# Patient Record
Sex: Female | Born: 1950 | Race: Black or African American | Hispanic: No | Smoking: Current every day smoker
Health system: Southern US, Community
[De-identification: ages and names within clinical notes are randomized; demographics above are authoritative.]

## PROBLEM LIST (undated history)

## (undated) DIAGNOSIS — I10 Essential (primary) hypertension: Secondary | ICD-10-CM

## (undated) DIAGNOSIS — J45909 Unspecified asthma, uncomplicated: Secondary | ICD-10-CM

## (undated) DIAGNOSIS — J449 Chronic obstructive pulmonary disease, unspecified: Secondary | ICD-10-CM

---

## 1980-07-01 HISTORY — PX: TUBAL LIGATION: SHX77

## 2007-05-20 ENCOUNTER — Ambulatory Visit: Payer: Self-pay | Admitting: Family Medicine

## 2007-05-26 ENCOUNTER — Ambulatory Visit: Payer: Self-pay | Admitting: *Deleted

## 2007-06-23 ENCOUNTER — Ambulatory Visit: Payer: Self-pay | Admitting: Internal Medicine

## 2007-07-01 ENCOUNTER — Ambulatory Visit: Payer: Self-pay | Admitting: Internal Medicine

## 2007-07-03 ENCOUNTER — Ambulatory Visit: Payer: Self-pay | Admitting: Internal Medicine

## 2007-08-13 ENCOUNTER — Encounter: Admission: RE | Admit: 2007-08-13 | Discharge: 2007-08-13 | Payer: Self-pay | Admitting: Family Medicine

## 2007-09-01 ENCOUNTER — Ambulatory Visit: Payer: Self-pay | Admitting: Internal Medicine

## 2007-09-16 ENCOUNTER — Encounter: Admission: RE | Admit: 2007-09-16 | Discharge: 2007-10-07 | Payer: Self-pay | Admitting: Family Medicine

## 2008-07-01 HISTORY — PX: ROTATOR CUFF REPAIR: SHX139

## 2012-11-16 ENCOUNTER — Encounter (HOSPITAL_COMMUNITY): Payer: Self-pay | Admitting: Emergency Medicine

## 2012-11-16 ENCOUNTER — Emergency Department (INDEPENDENT_AMBULATORY_CARE_PROVIDER_SITE_OTHER)
Admission: EM | Admit: 2012-11-16 | Discharge: 2012-11-16 | Disposition: A | Discharge status: Home / Self Care | Payer: Medicaid Other | Source: Home / Self Care | Attending: Emergency Medicine | Admitting: Emergency Medicine

## 2012-11-16 DIAGNOSIS — J309 Allergic rhinitis, unspecified: Secondary | ICD-10-CM

## 2012-11-16 DIAGNOSIS — J40 Bronchitis, not specified as acute or chronic: Secondary | ICD-10-CM

## 2012-11-16 DIAGNOSIS — J302 Other seasonal allergic rhinitis: Secondary | ICD-10-CM

## 2012-11-16 HISTORY — DX: Essential (primary) hypertension: I10

## 2012-11-16 HISTORY — DX: Unspecified asthma, uncomplicated: J45.909

## 2012-11-16 MED ORDER — PREDNISONE 20 MG PO TABS: 40.0000 mg | ORAL_TABLET | Freq: Every day | ORAL | Status: AC

## 2012-11-16 MED ORDER — AZITHROMYCIN 250 MG PO TABS: 250.0000 mg | ORAL_TABLET | Freq: Every day | ORAL | Status: AC

## 2012-11-16 NOTE — ED Notes (Addendum)
Pt c/o sneezing, runny nose, cough and congestion x 1 week. Granddaughter has been sick as well. Has been taking OTC allergy medicine and nasal spray with no relief. Allergy symptoms have exacerbated her asthma and she been using rescue inhaler with no relief. Has headaches. Pt is alert and oriented.

## 2012-11-16 NOTE — ED Provider Notes (Signed)
History     CSN: [id number]  Arrival date & time 11/16/12  1351   First MD Initiated Contact with Patient 11/16/12 1455      Chief Complaint  Patient presents with  . URI    (Consider location/radiation/quality/duration/timing/severity/associated sxs/prior treatment) HPI Comments: Patient presents urgent care describing that she's been taking multiple allergy medicines in use of Zyrtec, Flonase and her albuterol frequently. Every year she develops seasonal allergies. She has been referred to see an allergist by her primary care Dr. and they were proposing to sensitization treatment which she has not yet pursued. Describe dry cough productive cough throughout the day but most pronounced at night with intermittent wheezing. Denies any fevers. Congested and runny nose frequent sneezing. Denies any chest pains, arthralgias, myalgias or unintentional weight loss. She feels she is developing bronchitis now, as she tends to do this every year. Her Dr. usually treats her with z-pack...  Patient is a 62 y.o. female presenting with URI. The history is provided by the patient.  URI Presenting symptoms: congestion, cough, rhinorrhea and sore throat   Presenting symptoms: no fatigue and no fever   Severity:  Moderate Onset quality:  Gradual Timing:  Intermittent Progression:  Waxing and waning Chronicity:  Recurrent Relieved by:  Prescription medications Worsened by:  Nothing tried Associated symptoms: wheezing   Associated symptoms: no arthralgias, no headaches, no neck pain and no sinus pain   Risk factors: no immunosuppression, no recent travel and no sick contacts     Past Medical History  Diagnosis Date  . Hypertension   . Asthma     Past Surgical History  Procedure Laterality Date  . Rotator cuff repair    . Tubal ligation      History reviewed. No pertinent family history.  History  Substance Use Topics  . Smoking status: Current Every Day Smoker -- 0.50 packs/day   Types: Cigarettes  . Smokeless tobacco: Not on file  . Alcohol Use: No    OB History   Grav Para Term Preterm Abortions TAB SAB Ect Mult Living                  Review of Systems  Constitutional: Negative for fever, chills, diaphoresis, activity change, appetite change and fatigue.  HENT: Positive for congestion, sore throat and rhinorrhea. Negative for neck pain.   Respiratory: Positive for cough and wheezing. Negative for apnea, chest tightness and shortness of breath.   Musculoskeletal: Negative for arthralgias and gait problem.  Skin: Negative for rash.  Neurological: Negative for headaches.  Hematological: Negative for adenopathy. Does not bruise/bleed easily.    Allergies  Codeine and Penicillins  Home Medications   Current Outpatient Rx  Name  Route  Sig  Dispense  Refill  . cetirizine (ZYRTEC) 10 MG tablet   Oral   Take 10 mg by mouth daily.         . fluticasone (FLONASE) 50 MCG/ACT nasal spray   Nasal   Place 2 sprays into the nose daily.         Marland Kitchen lisinopril-hydrochlorothiazide (PRINZIDE,ZESTORETIC) 20-25 MG per tablet   Oral   Take 1 tablet by mouth daily.         . montelukast (SINGULAIR) 10 MG tablet   Oral   Take 10 mg by mouth at bedtime.         Marland Kitchen omeprazole (PRILOSEC) 10 MG capsule   Oral   Take 10 mg by mouth daily.         Marland Kitchen  potassium chloride (KLOR-CON) 20 MEQ packet   Oral   Take 20 mEq by mouth 2 (two) times daily.         . simvastatin (ZOCOR) 20 MG tablet   Oral   Take 20 mg by mouth every evening.         Marland Kitchen azithromycin (ZITHROMAX) 250 MG tablet   Oral   Take 1 tablet (250 mg total) by mouth daily. Take first 2 tablets together, then 1 every day until finished.   6 tablet   0   . predniSONE (DELTASONE) 20 MG tablet   Oral   Take 2 tablets (40 mg total) by mouth daily.   14 tablet   0     BP 123/80  Pulse 86  Temp(Src) 98.3 F (36.8 C) (Oral)  Resp 20  SpO2 97%  Physical Exam  Nursing note and vitals  reviewed. Constitutional: Vital signs are normal. She appears well-developed and well-nourished.  Non-toxic appearance. She does not have a sickly appearance. She does not appear ill. No distress.  HENT:  Head: Normocephalic.  Mouth/Throat: No oropharyngeal exudate.  Eyes: Conjunctivae are normal. Pupils are equal, round, and reactive to light. No scleral icterus.  Neck: No JVD present.  Pulmonary/Chest: Effort normal and breath sounds normal. No respiratory distress. She has no decreased breath sounds. She has no wheezes. She has no rhonchi. She has no rales. She exhibits no tenderness.  Lymphadenopathy:    She has no cervical adenopathy.  Neurological: She is alert.  Skin: No erythema.    ED Course  Procedures (including critical care time)  Labs Reviewed - No data to display No results found.   1. Bronchitis   2. Seasonal allergies       MDM  Are mentally induced bronchitis. Have encouraged and instructed patient to use prednisone for the next 7 days. Start with antibiotics if no improvement after 2-3 days. Patient is afebrile no respiratory distress. Have also instructed patient to follow for primary care Dr. if cough was to persist beyond 2 weeks. She'll seems to be more contemplative about pursuing the desensitization tx.   Jimmie Molly, MD 11/16/12 [phone]

## 2013-10-05 ENCOUNTER — Emergency Department (INDEPENDENT_AMBULATORY_CARE_PROVIDER_SITE_OTHER)
Admission: EM | Admit: 2013-10-05 | Discharge: 2013-10-05 | Disposition: A | Discharge status: Home / Self Care | Payer: Medicare Other | Source: Home / Self Care

## 2013-10-05 ENCOUNTER — Encounter (HOSPITAL_COMMUNITY): Payer: Self-pay | Admitting: Emergency Medicine

## 2013-10-05 DIAGNOSIS — J309 Allergic rhinitis, unspecified: Secondary | ICD-10-CM | POA: Diagnosis not present

## 2013-10-05 DIAGNOSIS — J019 Acute sinusitis, unspecified: Secondary | ICD-10-CM

## 2013-10-05 MED ORDER — MONTELUKAST SODIUM 10 MG PO TABS: 10.0000 mg | ORAL_TABLET | Freq: Every day | ORAL | Status: DC

## 2013-10-05 MED ORDER — ALBUTEROL SULFATE HFA 108 (90 BASE) MCG/ACT IN AERS
INHALATION_SPRAY | RESPIRATORY_TRACT | Status: AC
  Filled 2013-10-05: qty 6.7

## 2013-10-05 MED ORDER — FLUTICASONE PROPIONATE 50 MCG/ACT NA SUSP: 2.0000 | Freq: Every day | NASAL | Status: DC

## 2013-10-05 MED ORDER — METHYLPREDNISOLONE ACETATE 80 MG/ML IJ SUSP
INTRAMUSCULAR | Status: AC
  Filled 2013-10-05: qty 1

## 2013-10-05 MED ORDER — ALBUTEROL SULFATE HFA 108 (90 BASE) MCG/ACT IN AERS
2.0000 | INHALATION_SPRAY | RESPIRATORY_TRACT | Status: DC | PRN
  Administered 2013-10-05: 2 via RESPIRATORY_TRACT

## 2013-10-05 MED ORDER — METHYLPREDNISOLONE ACETATE 80 MG/ML IJ SUSP
80.0000 mg | Freq: Once | INTRAMUSCULAR | Status: AC
  Administered 2013-10-05: 80 mg via INTRAMUSCULAR

## 2013-10-05 MED ORDER — DOXYCYCLINE HYCLATE 100 MG PO TABS: 100.0000 mg | ORAL_TABLET | Freq: Two times a day (BID) | ORAL | Status: DC

## 2013-10-05 MED ORDER — PREDNISONE 20 MG PO TABS: ORAL_TABLET | ORAL | Status: DC

## 2013-10-05 NOTE — ED Notes (Signed)
Allergies for years and flared up in February with a cold.  States it got worse in the past 2 weeks. Has to take Prednisone every year.  C/o cough prod. of yellow sputum, itching of eyes, ears, nose and throat, no fever.  C/o nasal congestion also.

## 2013-10-05 NOTE — Discharge Instructions (Signed)
People who suffer from allergies frequently have symptoms of nasal congestion, runny nose, sneezing, itching of the nose, eyes, ears or throat, mucous in the throat, watering of the eyes and cough.  These symptoms are caused by the body's immune response to environmental allergens.  For seasonal allergies this is pollen (tree pollen in the spring, grass pollen in the summer, and weed pollen in the fall).  Year round allergy symptoms are usually caused by dust or mould.  Many people have year round symptoms which are worse seasonally. ° °For people who have seasonal allergies, pollen avoidance may help to decease symptoms.  This means keeping windows in the house down and windows in the car up.  Run your air conditioning, since this filters out many of the pollen particles.  If you have to spend a prolonged time outdoors during heavy pollen season, it might be prudent to wear a mask.  These can be purchased at any drug store.  When you come in after heavy pollen exposure, your skin, clothing and hair are covered with pollen.  Changing your clothing, taking a shower, and washing your hair may help with your pollen exposure.  Also, your bedding, pillow, and pillowcase may become contaminated with pollen, so frequent washing of your bedding and pillowcase and changing out your pillow may help as well.  (Your pillow can also be a source of dust and mould exposure as well.)  Showering at bedtime may also help. ° °During heavy pollen season (April and September), a large amount of pollen gets trapped in your nasal cavity.  This can contribute to ongoing allergy symptoms.  Saline irrigation of the nasal cavity can help to remove this and relieve allergy symptoms.  This can be accomplished in several ways.  You can mix up your own saline solution using the following recipe:  8 oz of distilled or boiled water, 1/2 tsp of table salt (sodium choride), and a pinch of baking soda (sodium bicarbonate).  If nasal congestion is a  problem.  1 to 2 drops of Afrin solution can be added to this as well.  To do the irrigation, purchase a nasal bulb syringe (the kind you would use to clean out an infant's nose).  Fill this up with the solution, lean you head over a sink with the nostril to be irrigated turned upward, insert the syringe into your nostril, making a tight seal, and gently irrigate, compressing the bulb.  The solution will flow into your nostril and out the other, some may also come out of your mouth.  Repeat this on both sides.  You can do this once daily.  Do not store the solution, mix it up fresh each day.  A commercial solution, called Neomed Solution, can be purchased over the counter without prescription.  You can also use a Netti Pot for irrigation.  These can be purchased at your drug store as well.  Be sure to use distilled or boiled water in these as well and make sure the Netti pot is completely dry between uses. ° °Over the counter medications can be helpful, and in many cases can completely control allergy symptoms without resorting to more expensive prescription meds.   °Antihistamines are the mainstay of allergy treatment.  The newer non-sedating antihistamines are all available over the counter.  These include Allegra, Zyrtec, and Claritin which also can be purchased in their generic forms: fexofenadine, cetirizine, and  Cetirizine.  Combining these meds with a decongestant such as pseudoephedrine or   phenylephrine helps with nasal congestion, but decongestants can also cause elevations in blood pressure.  Pseudoephedrine tends to be more effective than phenylephrine.  The older, more sedating antihistamines such as chlorpheniramine, brompheniramine, and diphenhydramine are also very effective, sometimes more so than the newer antihistamines, but with the price of more sedation.  You should be careful about driving or operating heavy machinery when taking sedating antihistamines, and men with enlarged prostates may  experience urinary retention with diphenhydramine. ° °Naslacrom nasal spray can be very effective for allergy symptoms.  It is available over the counter and has very few side effects.  The dosage is 2 sprays in each nostril twice daily.  It is recommended that you pinch your nose shut for 30 seconds after using it since it is a watery spray and can run out.  It can be used as long as needed.  There is no risk of dependency. ° °For people with year round allergies, dust, mould, insect emanations, and pet dander are usually the culprits. ° °To avoid dust, you need to avoid dust mites which are the main source of allergens in house dust.  Cover your bedding with moisture and mite impervious covers.  These can be purchased at any mattress store.  The modern covers are a little expensive, but not at all uncomfortable. Keeping your house as dry as possible will also help to control dust mites.  Do not use a humidifier and it may help to use a dehumidifier.  Use of a HEPA filter air filter is also a great way to reduce dust and mold exposure.  These units can be purchased commercially.  Make sure to buy one large enough for the room you intend to use it.  Change the filter as per the manufacturer's instructions.  Also, using a HEPA filter vacuum for your carpets is helpful.  There are chemicals that you can sprinkle on your carpet called acaricides that will kill dist mites.  The most commonly used brand is Acarosan.  This can be purchased on line.  It does have to be periodically reapplied.  Wash you pillows and bedsheets regularly in hot water. ° ° ° ° ° ° ° ° ° °

## 2013-10-05 NOTE — ED Provider Notes (Signed)
Chief Complaint   Chief Complaint  Patient presents with  . Allergies    History of Present Illness   Jeanne Mcclure is a 63 year old female who has had a many year long history of recurring, year-round allergies. This year they flared up in February and. She's had cough productive yellow sputum, shortness of breath, chest tightness, wheezing, nasal congestion with yellow drainage, sneezing, itching of the eyes, nose, and the throat, headache, sinus pressure, ear congestion. She denies fever or chills. She's been on multiple allergy and asthma medications in the past. Singulair tends to help. And she's also gotten good luck with inhalers, but her insurance won't pay for them.  Review of Systems   Other than as noted above, the patient denies any of the following symptoms. Systemic:  No fever, chills, or headache. Eye:  No redness, itching, watering, pain or drainage. ENT:  No earache, ear congestion, sinus pressure or pain, post nasal drip, or sore throat. Lungs:  No cough, sputum production, wheezing, or shortness of breath. Skin:  No rash or itching.  PMFSH   Past medical history, family history, social history, meds, and allergies were reviewed.  She is allergic to penicillin. She has high blood pressure and takes lisinopril/hydrochlorothiazide, omeprazole, potassium chloride, and simvastatin.  Physical Exam     Vital signs:  BP 118/72  Pulse 100  Temp(Src) 97.7 F (36.5 C) (Oral)  Resp 20  SpO2 97% General:  Alert, in no distress. Eye:  No conjunctival injection or drainage. Lids were normal. ENT:  TMs and canals were normal, without erythema or inflammation.  Nasal mucosa was congested, pale and boggy with clear drainage.  Mucous membranes were moist.  Pharynx was clear, without exudate or drainage.  There were no oral ulcerations or lesions. Neck:  Supple, no adenopathy, tenderness or mass. Lungs:  No respiratory distress.  Lungs were clear to auscultation, without wheezes,  rales or rhonchi.  Breath sounds were clear and equal bilaterally. Heart:  Regular rhythm, without gallops, murmers or rubs. Skin:  Clear, warm, and dry, without rash or lesions.  Course in Urgent Care Center   Given Depo-Medrol 80 mg IM. She was also provided with an albuterol HFA inhaler to take 2 puffs every 4 hours as needed.  Assessment   The primary encounter diagnosis was Allergic rhinitis. A diagnosis of Acute sinusitis was also pertinent to this visit.  Plan     1.  Meds:  The following meds were prescribed:   Discharge Medication List as of 10/05/2013  5:53 PM    START taking these medications   Details  doxycycline (VIBRA-TABS) 100 MG tablet Take 1 tablet (100 mg total) by mouth 2 (two) times daily., Starting 10/05/2013, Until Discontinued, Normal    !! fluticasone (FLONASE) 50 MCG/ACT nasal spray Place 2 sprays into both nostrils daily., Starting 10/05/2013, Until Discontinued, Normal    !! montelukast (SINGULAIR) 10 MG tablet Take 1 tablet (10 mg total) by mouth at bedtime., Starting 10/05/2013, Until Discontinued, Normal    predniSONE (DELTASONE) 20 MG tablet Take 3 daily for 7 days, 2 daily for 7 days, 1 daily for 7 days., Normal     !! - Potential duplicate medications found. Please discuss with provider.      2.  Patient Education/Counseling:  The patient was given appropriate handouts, self care instructions, and instructed in symptomatic relief. The patient was instructed in allergen avoidance.    3.  Follow up:  The patient was told to follow up here  if no better in 3 to 4 days, or sooner if becoming worse in any way, and given some red flag symptoms such as fever or difficulty breathing which would prompt immediate return.  Follow up with Dr. [place] CallasSharma as soon as possible.        Reuben Likesavid C Chau Sawin, MD 10/05/13 2220

## 2014-04-21 ENCOUNTER — Encounter (HOSPITAL_COMMUNITY): Payer: Self-pay | Admitting: Emergency Medicine

## 2014-04-21 ENCOUNTER — Emergency Department (HOSPITAL_COMMUNITY): Payer: Medicare HMO

## 2014-04-21 ENCOUNTER — Emergency Department (HOSPITAL_COMMUNITY)
Admission: EM | Admit: 2014-04-21 | Discharge: 2014-04-21 | Disposition: A | Discharge status: Home / Self Care | Payer: Medicare HMO | Attending: Emergency Medicine | Admitting: Emergency Medicine

## 2014-04-21 DIAGNOSIS — I10 Essential (primary) hypertension: Secondary | ICD-10-CM | POA: Insufficient documentation

## 2014-04-21 DIAGNOSIS — R110 Nausea: Secondary | ICD-10-CM

## 2014-04-21 DIAGNOSIS — Z880 Allergy status to penicillin: Secondary | ICD-10-CM | POA: Insufficient documentation

## 2014-04-21 DIAGNOSIS — R52 Pain, unspecified: Secondary | ICD-10-CM

## 2014-04-21 DIAGNOSIS — M1712 Unilateral primary osteoarthritis, left knee: Secondary | ICD-10-CM

## 2014-04-21 DIAGNOSIS — Z7951 Long term (current) use of inhaled steroids: Secondary | ICD-10-CM | POA: Diagnosis not present

## 2014-04-21 DIAGNOSIS — J45909 Unspecified asthma, uncomplicated: Secondary | ICD-10-CM | POA: Diagnosis not present

## 2014-04-21 DIAGNOSIS — Z79899 Other long term (current) drug therapy: Secondary | ICD-10-CM | POA: Diagnosis not present

## 2014-04-21 DIAGNOSIS — Z87891 Personal history of nicotine dependence: Secondary | ICD-10-CM | POA: Insufficient documentation

## 2014-04-21 DIAGNOSIS — M179 Osteoarthritis of knee, unspecified: Secondary | ICD-10-CM | POA: Insufficient documentation

## 2014-04-21 DIAGNOSIS — M19072 Primary osteoarthritis, left ankle and foot: Secondary | ICD-10-CM | POA: Diagnosis not present

## 2014-04-21 DIAGNOSIS — Z7952 Long term (current) use of systemic steroids: Secondary | ICD-10-CM | POA: Insufficient documentation

## 2014-04-21 DIAGNOSIS — R112 Nausea with vomiting, unspecified: Secondary | ICD-10-CM | POA: Insufficient documentation

## 2014-04-21 MED ORDER — SODIUM CHLORIDE 0.9 % IV SOLN: 1000.0000 mL | INTRAVENOUS | Status: DC

## 2014-04-21 MED ORDER — SODIUM CHLORIDE 0.9 % IV SOLN: 1000.0000 mL | Freq: Once | INTRAVENOUS | Status: DC

## 2014-04-21 MED ORDER — ONDANSETRON HCL 4 MG/2ML IJ SOLN
4.0000 mg | Freq: Once | INTRAMUSCULAR | Status: DC
  Filled 2014-04-21: qty 2

## 2014-04-21 MED ORDER — ONDANSETRON 8 MG PO TBDP
8.0000 mg | ORAL_TABLET | Freq: Once | ORAL | Status: AC
  Administered 2014-04-21: 8 mg via ORAL
  Filled 2014-04-21: qty 1

## 2014-04-21 MED ORDER — ONDANSETRON HCL 4 MG PO TABS: 4.0000 mg | ORAL_TABLET | Freq: Four times a day (QID) | ORAL | Status: DC | PRN

## 2014-04-21 NOTE — ED Provider Notes (Signed)
CSN: [id number]     Arrival date & time 04/21/14  0425 History   First MD Initiated Contact with Patient 04/21/14 [phone]     Chief Complaint  Patient presents with  . Nausea     (Consider location/radiation/quality/duration/timing/severity/associated sxs/prior Treatment) The history is provided by the patient.   63 year old female comes in because of nausea and vomiting. She states she was awakened at about 1 AM because of nausea and she vomited once. She is feeling somewhat better. She did not have any abdominal pain. She denies any diarrhea. She is concerned because the people in the apartment below her doing drugs and she is worried that something might be getting into her system and she wants to be tested for them. She also is complaining of some pain in her left knee and foot. These are chronic problems. She states there's a knot on her left foot and she points to the dorsum of the proximal midfoot. This note will tends to come and go and pain waxes and wanes. She also has had pain in her left knee and did see her PCP did have there was some fluid on her knee and suggested that it was probably from arthritis. She denies any trauma to her foot or knee. She does note problems with her right foot her right knee.  Past Medical History  Diagnosis Date  . Hypertension   . Asthma    Past Surgical History  Procedure Laterality Date  . Rotator cuff repair  2010  . Tubal ligation  1982   Family History  Problem Relation Age of Onset  . Heart failure Mother   . Heart failure Father    History  Substance Use Topics  . Smoking status: Former Smoker -- 0.50 packs/day    Types: Cigarettes    Quit date: 09/04/2013  . Smokeless tobacco: Not on file  . Alcohol Use: No   OB History   Grav Para Term Preterm Abortions TAB SAB Ect Mult Living                 Review of Systems  All other systems reviewed and are negative.     Allergies  Codeine and Penicillins  Home Medications    Prior to Admission medications   Medication Sig Start Date End Date Taking? Authorizing Provider  cetirizine (ZYRTEC) 10 MG tablet Take 10 mg by mouth daily.    Historical Provider, MD  fluticasone (FLONASE) 50 MCG/ACT nasal spray Place 2 sprays into the nose daily.    Historical Provider, MD  lisinopril-hydrochlorothiazide (PRINZIDE,ZESTORETIC) 10-12.5 MG per tablet Take 1 tablet by mouth daily.    Historical Provider, MD  montelukast (SINGULAIR) 10 MG tablet Take 10 mg by mouth at bedtime.    Historical Provider, MD  omeprazole (PRILOSEC) 10 MG capsule Take 10 mg by mouth daily.    Historical Provider, MD  potassium chloride (KLOR-CON) 20 MEQ packet Take 20 mEq by mouth 2 (two) times daily.    Historical Provider, MD  predniSONE (DELTASONE) 20 MG tablet Take 3 daily for 7 days, 2 daily for 7 days, 1 daily for 7 days. 10/05/13   Reuben Likesavid C Keller, MD  simvastatin (ZOCOR) 20 MG tablet Take 20 mg by mouth every evening.    Historical Provider, MD   BP 125/75  Pulse 91  Temp(Src) 97.8 F (36.6 C) (Oral)  Resp 15  Ht 5\' 5"  (1.651 m)  SpO2 100% Physical Exam  Nursing note and vitals reviewed.  63 year  old female, resting comfortably and in no acute distress. Vital signs are normal. Oxygen saturation is 100%, which is normal. Head is normocephalic and atraumatic. PERRLA, EOMI. Oropharynx is clear. Neck is nontender and supple without adenopathy or JVD. Back is nontender and there is no CVA tenderness. Lungs are clear without rales, wheezes, or rhonchi. Chest is nontender. Heart has regular rate and rhythm without murmur. Abdomen is soft, flat, nontender without masses or hepatosplenomegaly and peristalsis is hypoactive. Extremities have no cyanosis or edema, full range of motion is present. Small effusion is present in the left knee. There is no instability and full passive range of motion is present. There are no nodules seen on the left appeared where the patient points to the nodule, this  is normal part of the tarsometatarsal joint.  Exam is intact with prompt capillary refill and normal sensation. Skin is warm and dry without rash. Neurologic: Mental status is normal, cranial nerves are intact, there are no motor or sensory deficits.  ED Course  Procedures (including critical care time) Labs Review Labs Reviewed  CBC WITH DIFFERENTIAL  COMPREHENSIVE METABOLIC PANEL  URINE RAPID DRUG SCREEN (HOSP PERFORMED)  LIPASE, BLOOD    Imaging Review Dg Knee Complete 4 Views Left  04/21/2014   CLINICAL DATA:  Generalized left knee pain.  No trauma.  EXAM: LEFT KNEE - COMPLETE 4+ VIEW  COMPARISON:  None.  FINDINGS: Degenerative changes in the left knee with mild tricompartment narrowing and osteophytosis. No evidence of acute fracture or dislocation of the left knee. Small left knee effusion. No focal bone lesion or bone destruction. No radiopaque soft tissue foreign bodies.  IMPRESSION: Degenerative changes in the left knee. Small effusion. No acute bony abnormalities.   Electronically Signed   By: Burman NievesWilliam  Stevens M.D.   On: 04/21/2014 05:34   Dg Foot Complete Left  04/21/2014   CLINICAL DATA:  Lateral left foot pain with no trauma.  EXAM: LEFT FOOT - COMPLETE 3+ VIEW  COMPARISON:  None.  FINDINGS: Left hallux valgus deformity with degenerative changes in the first metatarsal phalangeal joint. Degenerative changes in the intertarsal joints. No evidence of acute fracture or dislocation. No focal bone lesion or bone destruction. Plantar calcaneal spur.  IMPRESSION: Degenerative changes of the left foot. No acute bony abnormalities. Hallux valgus deformity.   Electronically Signed   By: Burman NievesWilliam  Stevens M.D.   On: 04/21/2014 05:35   MDM   Final diagnoses:  Pain  Nausea  Osteoarthritis of left knee, unspecified osteoarthritis type  Osteoarthritis of left foot, unspecified osteoarthritis type    Nausea and vomiting of uncertain cause. Screening labs will be obtained. Left foot and  knee pain which seemed most likely to be due to arthritis. X-rays will be obtained.  X-rays confirm presence of osteoarthritis. Lab was unable to draw blood from patient and she refused additional 6. She is also refused to give a urine sample. At this point, I see no evidence of that reaction. She is given a prescription for ondansetron and she was referred to orthopedics for followup of her arthritis.  Dione Boozeavid Martyn Timme, MD 04/21/14 [phone]

## 2014-04-21 NOTE — ED Notes (Signed)
Report from previous shift that the patient refused IV and blood draw for labs. Patient again asked about IV and labs. Again patient refused.

## 2014-04-21 NOTE — Discharge Instructions (Signed)
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 That is what is causing the pain there. Please followup with the orthopedic doctor for further evaluation. It is okay to take ibuprofen or naproxen for pain.   I do not see any sign on your evaluation that he had a reaction to drugs that were used in his vicinity. I believe your nausea was from a stomach virus.  Nausea, Adult Nausea is the feeling that you have an upset stomach or have to vomit. Nausea by itself is not likely a serious concern, but it may be an early sign of more serious medical problems. As nausea gets worse, it can lead to vomiting. If vomiting develops, there is the risk of dehydration.  CAUSES   Viral infections.  Food poisoning.  Medicines.  Pregnancy.  Motion sickness.  Migraine headaches.  Emotional distress.  Severe pain from any source.  Alcohol intoxication. HOME CARE INSTRUCTIONS  Get plenty of rest.  Ask your caregiver about specific rehydration instructions.  Eat small amounts of food and sip liquids more often.  Take all medicines as told by your caregiver. SEEK MEDICAL CARE IF:  You have not improved after 2 days, or you get worse.  You have a headache. SEEK IMMEDIATE MEDICAL CARE IF:   You have a fever.  You faint.  You keep vomiting or have blood in your vomit.  You are extremely weak or dehydrated.  You have dark or bloody stools.  You have severe chest or abdominal pain. MAKE SURE YOU:  Understand these instructions.  Will watch your condition.  Will get help right away if you are not doing well or get worse. Document Released: 07/25/2004 Document Revised: 03/11/2012 Document Reviewed: 02/27/2011 [facility] Patient Information 2015 [employer], [place]. This information is not intended to replace advice given to you by your health care provider. Make sure you discuss any questions you have with your health care provider.  Osteoarthritis Osteoarthritis is a  disease that causes soreness and inflammation of a joint. It occurs when the cartilage at the affected joint wears down. Cartilage acts as a cushion, covering the ends of bones where they meet to form a joint. Osteoarthritis is the most common form of arthritis. It often occurs in older people. The joints affected most often by this condition include those in the:  Ends of the fingers.  Thumbs.  Neck.  Lower back.  Knees.  Hips. CAUSES  Over time, the cartilage that covers the ends of bones begins to wear away. This causes bone to rub on bone, producing pain and stiffness in the affected joints.  RISK FACTORS Certain factors can increase your chances of having osteoarthritis, including:  Older age.  Excessive body weight.  Overuse of joints.  Previous joint injury. SIGNS AND SYMPTOMS   Pain, swelling, and stiffness in the joint.  Over time, the joint may lose its normal shape.  Small deposits of bone (osteophytes) may grow on the edges of the joint.  Bits of bone or cartilage can break off and float inside the joint space. This may cause more pain and damage. DIAGNOSIS  Your health care provider will do a physical exam and ask about your symptoms. Various tests may be ordered, such as:  X-rays of the affected joint.  An MRI scan.  Blood tests to rule out other types of arthritis.  Joint fluid tests. This involves using a needle to draw fluid from the joint and examining the fluid under a microscope.  TREATMENT  Goals of treatment are to control pain and improve joint function. Treatment plans may include:  A prescribed exercise program that allows for rest and joint relief.  A weight control plan.  Pain relief techniques, such as:  Properly applied heat and cold.  Electric pulses delivered to nerve endings under the skin (transcutaneous electrical nerve stimulation [TENS]).  Massage.  Certain nutritional supplements.  Medicines to control pain, such  as:  Acetaminophen.  Nonsteroidal anti-inflammatory drugs (NSAIDs), such as naproxen.  Narcotic or central-acting agents, such as tramadol.  Corticosteroids. These can be given orally or as an injection.  Surgery to reposition the bones and relieve pain (osteotomy) or to remove loose pieces of bone and cartilage. Joint replacement may be needed in advanced states of osteoarthritis. HOME CARE INSTRUCTIONS   Take medicines only as directed by your health care provider.  Maintain a healthy weight. Follow your health care provider's instructions for weight control. This may include dietary instructions.  Exercise as directed. Your health care provider can recommend specific types of exercise. These may include:  Strengthening exercises. These are done to strengthen the muscles that support joints affected by arthritis. They can be performed with weights or with exercise bands to add resistance.  Aerobic activities. These are exercises, such as brisk walking or low-impact aerobics, that get your heart pumping.  Range-of-motion activities. These keep your joints limber.  Balance and agility exercises. These help you maintain daily living skills.  Rest your affected joints as directed by your health care provider.  Keep all follow-up visits as directed by your health care provider. SEEK MEDICAL CARE IF:   Your skin turns red.  You develop a rash in addition to your joint pain.  You have worsening joint pain.  You have a fever along with joint or muscle aches. SEEK IMMEDIATE MEDICAL CARE IF:  You have a significant loss of weight or appetite.  You have night sweats. FOR MORE INFORMATION   National Institute of Arthritis and Musculoskeletal and Skin Diseases: www.niams.http://www.myers.net/nih.gov  [employer] Aging: https://walker.com/www.nia.nih.gov  American College of Rheumatology: www.rheumatology.org Document Released: 06/17/2005 Document Revised: 11/01/2013 Document Reviewed: 02/22/2013 [facility]ExitCare  Patient Information 2015 [place], [place]. This information is not intended to replace advice given to you by your health care provider. Make sure you discuss any questions you have with your health care provider.  Ondansetron tablets What is this medicine? ONDANSETRON (on DAN se tron) is used to treat nausea and vomiting caused by chemotherapy. It is also used to prevent or treat nausea and vomiting after surgery. This medicine may be used for other purposes; ask your health care provider or pharmacist if you have questions. COMMON BRAND NAME(S): Zofran What should I tell my health care provider before I take this medicine? They need to know if you have any of these conditions: -heart disease -history of irregular heartbeat -liver disease -low levels of magnesium or potassium in the blood -an unusual or allergic reaction to ondansetron, granisetron, other medicines, foods, dyes, or preservatives -pregnant or trying to get pregnant -breast-feeding How should I use this medicine? Take this medicine by mouth with a glass of water. Follow the directions on your prescription label. Take your doses at regular intervals. Do not take your medicine more often than directed. Talk to your pediatrician regarding the use of this medicine in children. Special care may be needed. Overdosage: If you think you have taken too much of this medicine contact a poison control center or emergency room  at once. NOTE: This medicine is only for you. Do not share this medicine with others. What if I miss a dose? If you miss a dose, take it as soon as you can. If it is almost time for your next dose, take only that dose. Do not take double or extra doses. What may interact with this medicine? Do not take this medicine with any of the following medications: -apomorphine -certain medicines for fungal infections like fluconazole, itraconazole, ketoconazole, posaconazole,  voriconazole -cisapride -dofetilide -dronedarone -pimozide -thioridazine -ziprasidone This medicine may also interact with the following medications: -carbamazepine -certain medicines for depression, anxiety, or psychotic disturbances -fentanyl -linezolid -MAOIs like Carbex, Eldepryl, Marplan, Nardil, and Parnate -methylene blue (injected into a vein) -other medicines that prolong the QT interval (cause an abnormal heart rhythm) -phenytoin -rifampicin -tramadol This list may not describe all possible interactions. Give your health care provider a list of all the medicines, herbs, non-prescription drugs, or dietary supplements you use. Also tell them if you smoke, drink alcohol, or use illegal drugs. Some items may interact with your medicine. What should I watch for while using this medicine? Check with your doctor or health care professional right away if you have any sign of an allergic reaction. What side effects may I notice from receiving this medicine? Side effects that you should report to your doctor or health care professional as soon as possible: -allergic reactions like skin rash, itching or hives, swelling of the face, lips or tongue -breathing problems -confusion -dizziness -fast or irregular heartbeat -feeling faint or lightheaded, falls -fever and chills -loss of balance or coordination -seizures -sweating -swelling of the hands or feet -tightness in the chest -tremors -unusually weak or tired Side effects that usually do not require medical attention (report to your doctor or health care professional if they continue or are bothersome): -constipation or diarrhea -headache This list may not describe all possible side effects. Call your doctor for medical advice about side effects. You may report side effects to FDA at 1-800-FDA-1088. Where should I keep my medicine? Keep out of the reach of children. Store between 2 and 30 degrees C (36 and 86 degrees F).  Throw away any unused medicine after the expiration date. NOTE: This sheet is a summary. It may not cover all possible information. If you have questions about this medicine, talk to your doctor, pharmacist, or health care provider.  2015, Elsevier/Gold Standard. (2013-03-24 16:27:45)
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
## 2014-04-21 NOTE — ED Notes (Addendum)
Pt refused anymore needle sticks. MD aware

## 2014-04-21 NOTE — ED Notes (Signed)
Bed: WA03 Expected date:  Expected time:  Means of arrival:  Comments: Bed 3, EMS, 63 F, Nausea
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
## 2014-11-02 ENCOUNTER — Other Ambulatory Visit: Payer: Self-pay | Admitting: Internal Medicine

## 2014-11-02 DIAGNOSIS — M810 Age-related osteoporosis without current pathological fracture: Secondary | ICD-10-CM

## 2014-11-02 DIAGNOSIS — E2839 Other primary ovarian failure: Secondary | ICD-10-CM

## 2014-12-13 ENCOUNTER — Ambulatory Visit
Admission: RE | Admit: 2014-12-13 | Discharge: 2014-12-13 | Disposition: A | Discharge status: Home / Self Care | Payer: Medicare Other | Source: Ambulatory Visit | Attending: Internal Medicine | Admitting: Internal Medicine

## 2014-12-13 DIAGNOSIS — E2839 Other primary ovarian failure: Secondary | ICD-10-CM

## 2014-12-13 DIAGNOSIS — M810 Age-related osteoporosis without current pathological fracture: Secondary | ICD-10-CM

## 2015-11-20 ENCOUNTER — Emergency Department (HOSPITAL_COMMUNITY)
Admission: EM | Admit: 2015-11-20 | Discharge: 2015-11-20 | Disposition: A | Discharge status: Home / Self Care | Payer: Medicare HMO | Attending: Emergency Medicine | Admitting: Emergency Medicine

## 2015-11-20 DIAGNOSIS — M545 Low back pain: Secondary | ICD-10-CM | POA: Diagnosis not present

## 2015-11-20 DIAGNOSIS — J45909 Unspecified asthma, uncomplicated: Secondary | ICD-10-CM | POA: Diagnosis not present

## 2015-11-20 DIAGNOSIS — R252 Cramp and spasm: Secondary | ICD-10-CM | POA: Diagnosis not present

## 2015-11-20 DIAGNOSIS — Z87891 Personal history of nicotine dependence: Secondary | ICD-10-CM | POA: Diagnosis not present

## 2015-11-20 DIAGNOSIS — I10 Essential (primary) hypertension: Secondary | ICD-10-CM | POA: Diagnosis not present

## 2015-11-20 DIAGNOSIS — M79652 Pain in left thigh: Secondary | ICD-10-CM | POA: Diagnosis present

## 2015-11-20 DIAGNOSIS — Z79899 Other long term (current) drug therapy: Secondary | ICD-10-CM | POA: Insufficient documentation

## 2015-11-20 DIAGNOSIS — E876 Hypokalemia: Secondary | ICD-10-CM

## 2015-11-20 DIAGNOSIS — G8929 Other chronic pain: Secondary | ICD-10-CM | POA: Insufficient documentation

## 2015-11-20 LAB — I-STAT CHEM 8, ED
BUN: 23 mg/dL — ABNORMAL HIGH (ref 6–20)
CREATININE: 0.8 mg/dL (ref 0.44–1.00)
Calcium, Ion: 1.01 mmol/L — ABNORMAL LOW (ref 1.13–1.30)
Chloride: 103 mmol/L (ref 101–111)
Glucose, Bld: 90 mg/dL (ref 65–99)
HEMATOCRIT: 45 % (ref 36.0–46.0)
HEMOGLOBIN: 15.3 g/dL — AB (ref 12.0–15.0)
POTASSIUM: 3.2 mmol/L — AB (ref 3.5–5.1)
SODIUM: 139 mmol/L (ref 135–145)
TCO2: 26 mmol/L (ref 0–100)

## 2015-11-20 MED ORDER — IBUPROFEN 800 MG PO TABS
800.0000 mg | ORAL_TABLET | Freq: Once | ORAL | Status: AC
  Administered 2015-11-20: 800 mg via ORAL
  Filled 2015-11-20: qty 1

## 2015-11-20 MED ORDER — POTASSIUM CHLORIDE CRYS ER 20 MEQ PO TBCR
40.0000 meq | EXTENDED_RELEASE_TABLET | Freq: Once | ORAL | Status: AC
  Administered 2015-11-20: 40 meq via ORAL
  Filled 2015-11-20: qty 2

## 2015-11-20 MED ORDER — TRAMADOL HCL 50 MG PO TABS: 50.0000 mg | ORAL_TABLET | Freq: Four times a day (QID) | ORAL | Status: DC | PRN

## 2015-11-20 MED ORDER — POTASSIUM CHLORIDE CRYS ER 20 MEQ PO TBCR: 40.0000 meq | EXTENDED_RELEASE_TABLET | Freq: Every day | ORAL | Status: DC

## 2015-11-20 MED ORDER — OXYCODONE-ACETAMINOPHEN 5-325 MG PO TABS
1.0000 | ORAL_TABLET | Freq: Once | ORAL | Status: AC
  Administered 2015-11-20: 1 via ORAL
  Filled 2015-11-20: qty 1

## 2015-11-20 NOTE — ED Notes (Signed)
Bed: [id number] Expected date:  Expected time:  Means of arrival:  Comments: 65 yo F  Left thigh cramping

## 2015-11-20 NOTE — ED Provider Notes (Signed)
CSN: [id number]     Arrival date & time 11/20/15  0508 History   First MD Initiated Contact with Patient 11/20/15 0518     Chief Complaint  Patient presents with  . Leg Pain     (Consider location/radiation/quality/duration/timing/severity/associated sxs/prior Treatment) HPI Comments: Patient presents to the ER for evaluation of thigh pain. Patient reports she has been having intermittent cramps in her right thigh tonight. She has had this happen before. Patient does have a history of chronic back pain. She previously was seen at a pain care Center, but no longer is seen. She reports she stopped going because she didn't like taking pain medications. She recently was seen and treated with a steroid taper for left-sided sciatica. Patient is not expressing any numbness, tingling or weakness in lower extremities. She does not have any change in bowel or bladder function. Pain is not currently present, she reports that it is intermittent and sporadic. She describes it as like a muscle cramp.  Patient is a 65 y.o. female presenting with leg pain.  Leg Pain   Past Medical History  Diagnosis Date  . Hypertension   . Asthma    Past Surgical History  Procedure Laterality Date  . Rotator cuff repair  2010  . Tubal ligation  1982   Family History  Problem Relation Age of Onset  . Heart failure Mother   . Heart failure Father    Social History  Substance Use Topics  . Smoking status: Former Smoker -- 0.50 packs/day    Types: Cigarettes    Quit date: 09/04/2013  . Smokeless tobacco: Not on file  . Alcohol Use: No   OB History    No data available     Review of Systems  Musculoskeletal: Positive for myalgias.  All other systems reviewed and are negative.     Allergies  Codeine and Penicillins  Home Medications   Prior to Admission medications   Medication Sig Start Date End Date Taking? Authorizing Provider  cetirizine (ZYRTEC) 10 MG tablet Take 10 mg by mouth daily.     Historical Provider, MD  fluticasone (FLONASE) 50 MCG/ACT nasal spray Place 2 sprays into the nose daily.    Historical Provider, MD  lisinopril-hydrochlorothiazide (PRINZIDE,ZESTORETIC) 10-12.5 MG per tablet Take 1 tablet by mouth daily.    Historical Provider, MD  montelukast (SINGULAIR) 10 MG tablet Take 10 mg by mouth at bedtime.    Historical Provider, MD  ondansetron (ZOFRAN) 4 MG tablet Take 1 tablet (4 mg total) by mouth every 6 (six) hours as needed for nausea or vomiting. 04/21/14   Dione Boozeavid Glick, MD  potassium chloride (K-DUR,KLOR-CON) 10 MEQ tablet Take 10 mEq by mouth daily.    Historical Provider, MD  simvastatin (ZOCOR) 20 MG tablet Take 20 mg by mouth every evening.    Historical Provider, MD   BP 137/85 mmHg  Pulse 86  Temp(Src) 98.5 F (36.9 C) (Oral)  Resp 22  SpO2 100% Physical Exam  Constitutional: She is oriented to person, place, and time. She appears well-developed and well-nourished. No distress.  HENT:  Head: Normocephalic and atraumatic.  Right Ear: Hearing normal.  Left Ear: Hearing normal.  Nose: Nose normal.  Mouth/Throat: Oropharynx is clear and moist and mucous membranes are normal.  Eyes: Conjunctivae and EOM are normal. Pupils are equal, round, and reactive to light.  Neck: Normal range of motion. Neck supple.  Cardiovascular: Regular rhythm, S1 normal and S2 normal.  Exam reveals no gallop and no friction  rub.   No murmur heard. Pulmonary/Chest: Effort normal and breath sounds normal. No respiratory distress. She exhibits no tenderness.  Abdominal: Soft. Normal appearance and bowel sounds are normal. There is no hepatosplenomegaly. There is no tenderness. There is no rebound, no guarding, no tenderness at McBurney's point and negative Murphy's sign. No hernia.  Musculoskeletal: Normal range of motion.       Right knee: Normal.       Right upper leg: She exhibits tenderness. She exhibits no swelling.       Right lower leg: She exhibits no tenderness and  no swelling.  Neurological: She is alert and oriented to person, place, and time. She has normal strength. No cranial nerve deficit or sensory deficit. Coordination normal. GCS eye subscore is 4. GCS verbal subscore is 5. GCS motor subscore is 6.  Skin: Skin is warm, dry and intact. No rash noted. No cyanosis.  Psychiatric: She has a normal mood and affect. Her speech is normal and behavior is normal. Thought content normal.  Nursing note and vitals reviewed.   ED Course  Procedures (including critical care time) Labs Review Labs Reviewed - No data to display  Imaging Review No results found. I have personally reviewed and evaluated these images and lab results as part of my medical decision-making.   EKG Interpretation None      MDM   Final diagnoses:  None  Chronic low back pain Muscle cramp  Patient presents to the ER for evaluation of cramping of the right thigh area. Patient denies any direct injury. Pain is not currently present at time of evaluation. She does have a history of chronic low back pain and sciatica. There is no neurologic deficit. Patient administered analgesia. Potassium checked. She will be discharged with analgesia and is to follow-up with primary care.    Gilda Crease, MD 11/20/15 [phone]

## 2015-11-20 NOTE — Discharge Instructions (Signed)
Take the higher dose of potassium that I have prescribed for the next 5 days, then go back to your normal dosing. Have your doctor recheck your potassium in one week.  Leg Cramps Leg cramps occur when a muscle or muscles tighten and you have no control over this tightening (involuntary muscle contraction). Muscle cramps can develop in any muscle, but the most common place is in the calf muscles of the leg. Those cramps can occur during exercise or when you are at rest. Leg cramps are painful, and they may last for a few seconds to a few minutes. Cramps may return several times before they finally stop. Usually, leg cramps are not caused by a serious medical problem. In many cases, the cause is not known. Some common causes include:  Overexertion.  Overuse from repetitive motions, or doing the same thing over and over.  Remaining in a certain position for a long period of time.  Improper preparation, form, or technique while performing a sport or an activity.  Dehydration.  Injury.  Side effects of some medicines.  Abnormally low levels of the salts and ions in your blood (electrolytes), especially potassium and calcium. These levels could be low if you are taking water pills (diuretics) or if you are pregnant. HOME CARE INSTRUCTIONS Watch your condition for any changes. Taking the following actions may help to lessen any discomfort that you are feeling:  Stay well-hydrated. Drink enough fluid to keep your urine clear or pale yellow.  Try massaging, stretching, and relaxing the affected muscle. Do this for several minutes at a time.  For tight or tense muscles, use a warm towel, heating pad, or hot shower water directed to the affected area.  If you are sore or have pain after a cramp, applying ice to the affected area may relieve discomfort.  Put ice in a plastic bag.  Place a towel between your skin and the bag.  Leave the ice on for 20 minutes, 2-3 times per day.  Avoid  strenuous exercise for several days if you have been having frequent leg cramps.  Make sure that your diet includes the essential minerals for your muscles to work normally.  Take medicines only as directed by your health care provider. SEEK MEDICAL CARE IF:  Your leg cramps get more severe or more frequent, or they do not improve over time.  Your foot becomes cold, numb, or blue.   This information is not intended to replace advice given to you by your health care provider. Make sure you discuss any questions you have with your health care provider.   Document Released: 07/25/2004 Document Revised: 11/01/2014 Document Reviewed: 05/25/2014 Elsevier Interactive Patient Education 2016 [employer].  Hypokalemia Hypokalemia means that the amount of potassium in the blood is lower than normal.Potassium is a chemical, called an electrolyte, that helps regulate the amount of fluid in the body. It also stimulates muscle contraction and helps nerves function properly.Most of the body's potassium is inside of cells, and only a very small amount is in the blood. Because the amount in the blood is so small, minor changes can be life-threatening. CAUSES  Antibiotics.  Diarrhea or vomiting.  Using laxatives too much, which can cause diarrhea.  Chronic kidney disease.  Water pills (diuretics).  Eating disorders (bulimia).  Low magnesium level.  Sweating a lot. SIGNS AND SYMPTOMS  Weakness.  Constipation.  Fatigue.  Muscle cramps.  Mental confusion.  Skipped heartbeats or irregular heartbeat (palpitations).  Tingling or numbness. DIAGNOSIS  Your health care provider can diagnose hypokalemia with blood tests. In addition to checking your potassium level, your health care provider may also check other lab tests. TREATMENT Hypokalemia can be treated with potassium supplements taken by mouth or adjustments in your current medicines. If your potassium level is very low, you may  need to get potassium through a vein (IV) and be monitored in the hospital. A diet high in potassium is also helpful. Foods high in potassium are:  Nuts, such as peanuts and pistachios.  Seeds, such as sunflower seeds and pumpkin seeds.  Peas, lentils, and lima beans.  Whole grain and bran cereals and breads.  Fresh fruit and vegetables, such as apricots, avocado, bananas, cantaloupe, kiwi, oranges, tomatoes, asparagus, and potatoes.  Orange and tomato juices.  Red meats.  Fruit yogurt. HOME CARE INSTRUCTIONS  Take all medicines as prescribed by your health care provider.  Maintain a healthy diet by including nutritious food, such as fruits, vegetables, nuts, whole grains, and lean meats.  If you are taking a laxative, be sure to follow the directions on the label. SEEK MEDICAL CARE IF:  Your weakness gets worse.  You feel your heart pounding or racing.  You are vomiting or having diarrhea.  You are diabetic and having trouble keeping your blood glucose in the normal range. SEEK IMMEDIATE MEDICAL CARE IF:  You have chest pain, shortness of breath, or dizziness.  You are vomiting or having diarrhea for more than 2 days.  You faint. MAKE SURE YOU:   Understand these instructions.  Will watch your condition.  Will get help right away if you are not doing well or get worse.   This information is not intended to replace advice given to you by your health care provider. Make sure you discuss any questions you have with your health care provider.   Document Released: 06/17/2005 Document Revised: 07/08/2014 Document Reviewed: 12/18/2012 Elsevier Interactive Patient Education [employer] Elsevier Inc.

## 2015-11-20 NOTE — ED Notes (Signed)
Patient ambulatory to the restroom with steady gait.  ?

## 2015-11-20 NOTE — ED Notes (Signed)
Pt BIB EMS from home c/o leg cramps (R thigh) x4 hours. Pt states this has happened before when she was on prednisone for her back pain. Ambulatory, A&Ox4.

## 2015-12-02 IMAGING — CR DG KNEE COMPLETE 4+V*L*
5 series · 5 of 5 positions shown · non-contrast
Comparison: None.

CLINICAL DATA: Generalized left knee pain.  No trauma.

EXAM:
LEFT KNEE - COMPLETE 4+ VIEW

[x knee ap left (1 of 4)]
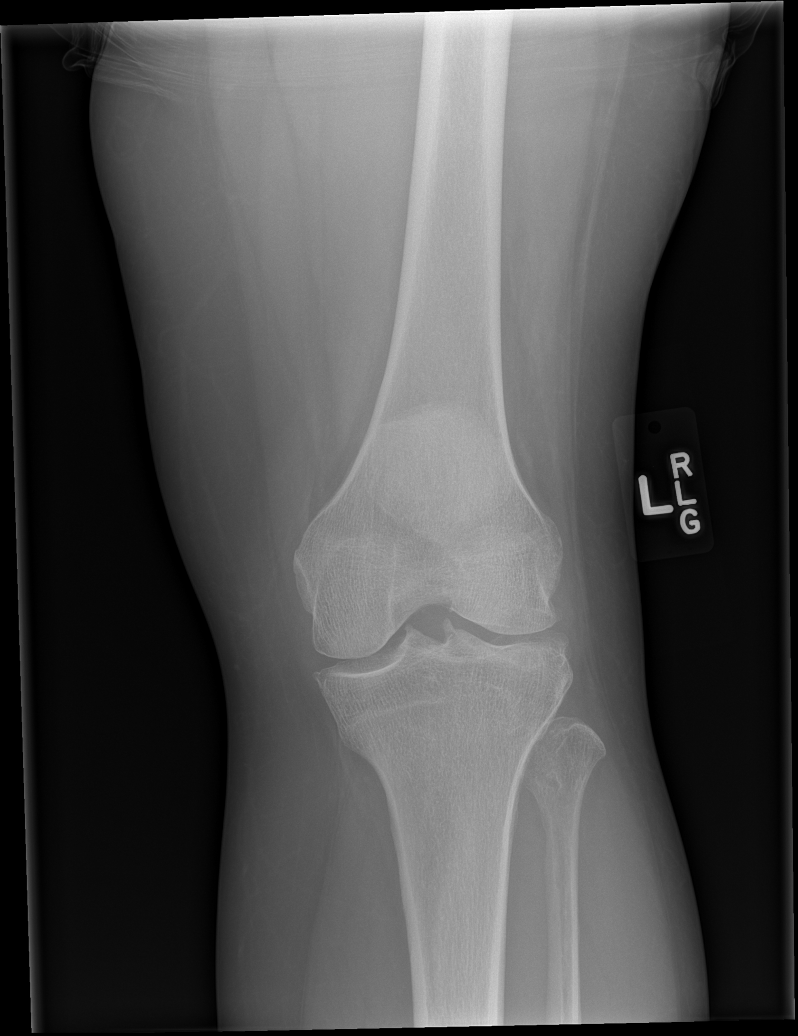

[x knee ap left (2 of 4)]
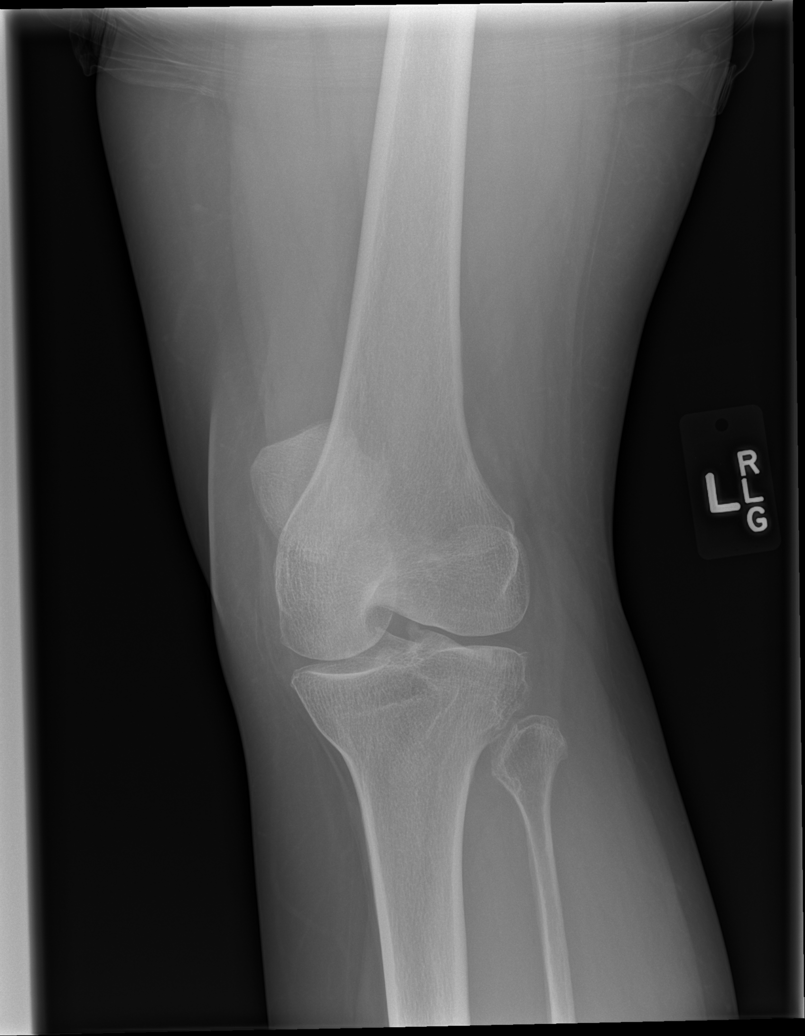

[x knee ap left (3 of 4)]
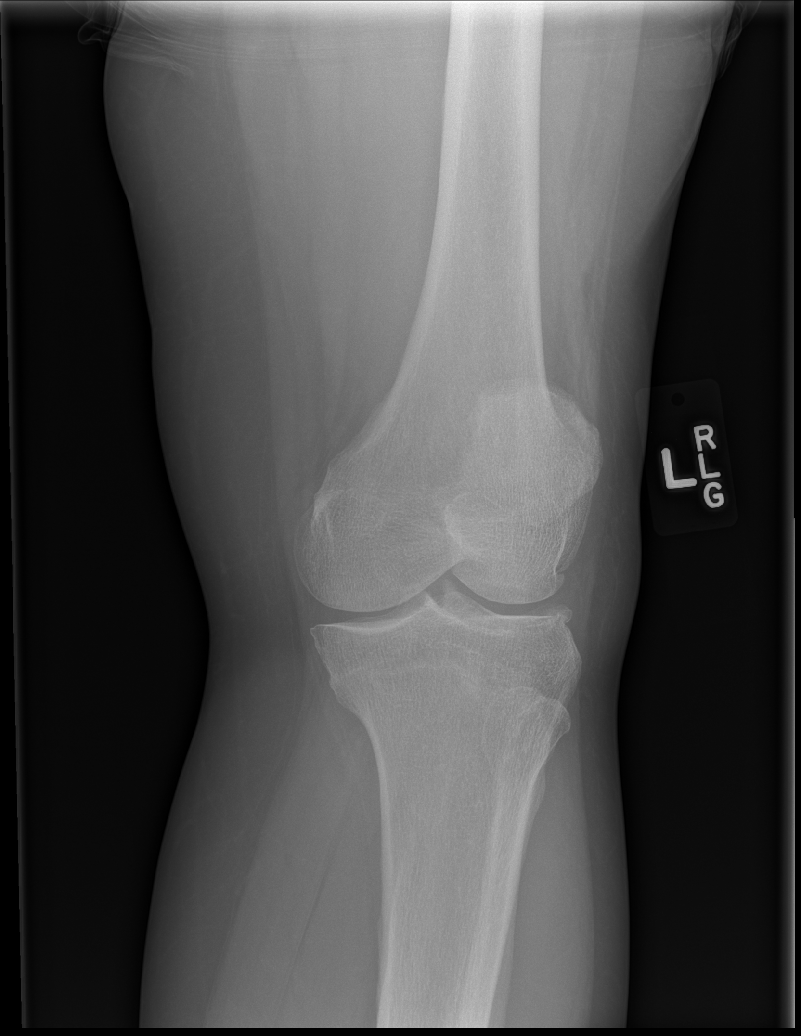

[x knee ap left (4 of 4)]
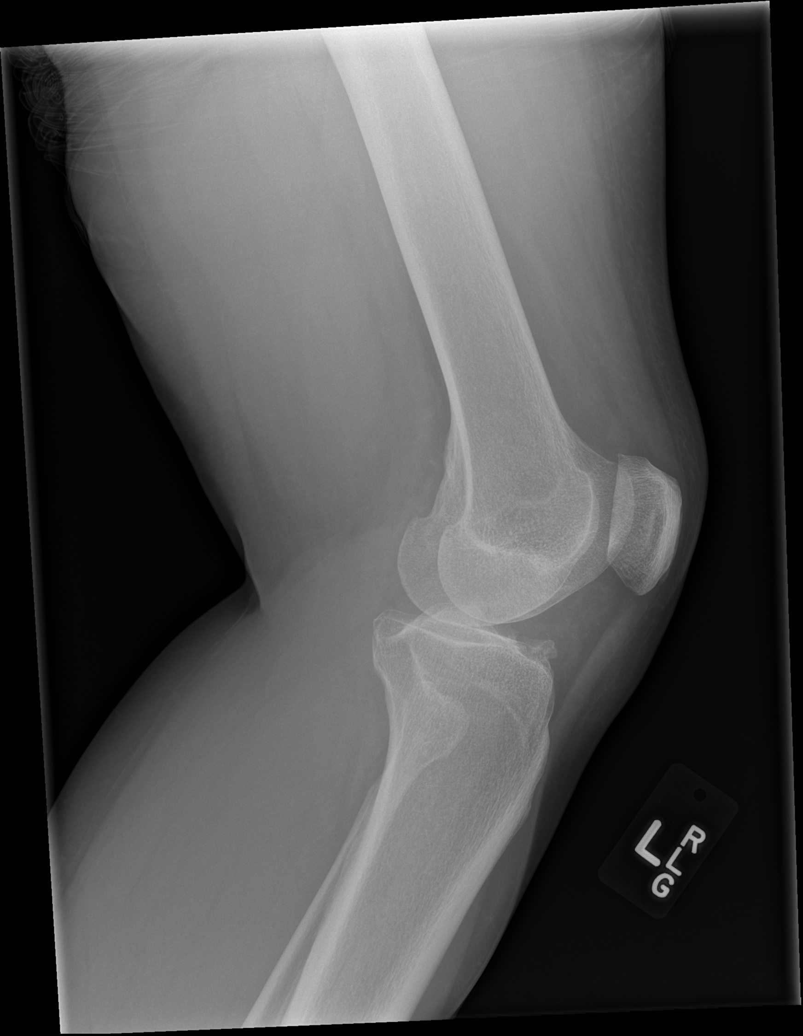

[x knee lat left]
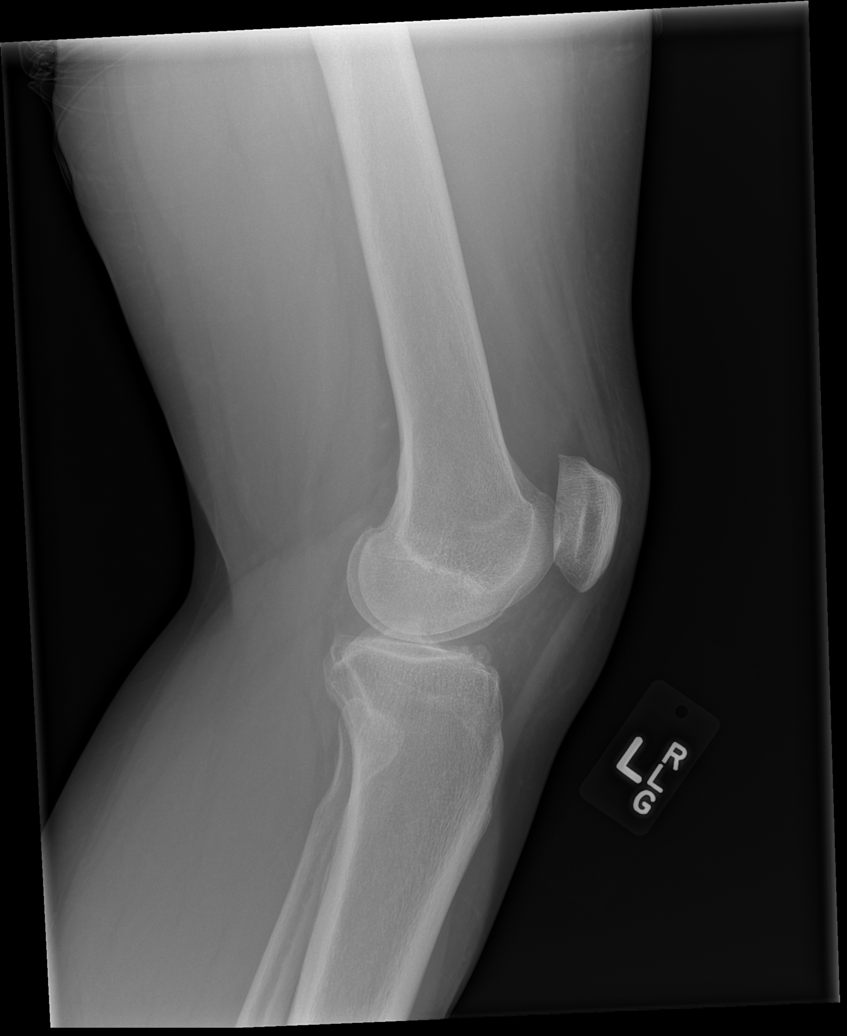

[5 of 5 positions shown; findings below may reference images not displayed]

FINDINGS: Degenerative changes in the left knee with mild tricompartment
narrowing and osteophytosis. No evidence of acute fracture or
dislocation of the left knee. Small left knee effusion. No focal
bone lesion or bone destruction. No radiopaque soft tissue foreign
bodies.
IMPRESSION: Degenerative changes in the left knee. Small effusion. No acute bony
abnormalities.

## 2016-05-25 ENCOUNTER — Ambulatory Visit (HOSPITAL_COMMUNITY)
Admission: EM | Admit: 2016-05-25 | Discharge: 2016-05-25 | Disposition: A | Discharge status: Home / Self Care | Payer: Medicare HMO | Attending: Emergency Medicine | Admitting: Emergency Medicine

## 2016-05-25 ENCOUNTER — Encounter (HOSPITAL_COMMUNITY): Payer: Self-pay | Admitting: Emergency Medicine

## 2016-05-25 DIAGNOSIS — M545 Low back pain, unspecified: Secondary | ICD-10-CM

## 2016-05-25 DIAGNOSIS — G8929 Other chronic pain: Secondary | ICD-10-CM | POA: Diagnosis not present

## 2016-05-25 DIAGNOSIS — M25461 Effusion, right knee: Secondary | ICD-10-CM | POA: Diagnosis not present

## 2016-05-25 DIAGNOSIS — M25552 Pain in left hip: Secondary | ICD-10-CM | POA: Diagnosis not present

## 2016-05-25 MED ORDER — IBUPROFEN 600 MG PO TABS: 600.0000 mg | ORAL_TABLET | Freq: Four times a day (QID) | ORAL | 0 refills | Status: DC | PRN

## 2016-05-25 MED ORDER — HYDROCODONE-ACETAMINOPHEN 5-325 MG PO TABS: 1.0000 | ORAL_TABLET | ORAL | 0 refills | Status: DC | PRN

## 2016-05-25 MED ORDER — METHYLPREDNISOLONE ACETATE 40 MG/ML IJ SUSP
INTRAMUSCULAR | Status: AC
  Filled 2016-05-25: qty 1

## 2016-05-25 NOTE — Discharge Instructions (Signed)
We did a cortisone injection in your knee to help with the pain and swelling. There is no sign of broken bones. Keep doing what you've been doing. The pain should gradually resolve over the next 2 weeks. I provided a prescription for ibuprofen 600 mg tablets as well as some additional hydrocodone. Continue your muscle relaxer. Follow-up as needed.
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
## 2016-06-06 ENCOUNTER — Encounter (HOSPITAL_COMMUNITY): Payer: Medicare HMO

## 2016-06-19 ENCOUNTER — Other Ambulatory Visit (HOSPITAL_COMMUNITY): Payer: Self-pay | Admitting: Family Medicine

## 2016-06-19 DIAGNOSIS — I714 Abdominal aortic aneurysm, without rupture, unspecified: Secondary | ICD-10-CM

## 2016-07-04 ENCOUNTER — Inpatient Hospital Stay (HOSPITAL_COMMUNITY): Admission: RE | Admit: 2016-07-04 | Payer: Medicare HMO | Source: Ambulatory Visit

## 2019-08-02 DIAGNOSIS — U071 COVID-19: Secondary | ICD-10-CM

## 2019-08-02 HISTORY — DX: COVID-19: U07.1

## 2019-10-22 ENCOUNTER — Other Ambulatory Visit: Payer: Self-pay

## 2019-10-22 ENCOUNTER — Ambulatory Visit (HOSPITAL_COMMUNITY)
Admission: EM | Admit: 2019-10-22 | Discharge: 2019-10-22 | Disposition: A | Discharge status: Home / Self Care | Payer: 59 | Attending: Family Medicine | Admitting: Family Medicine

## 2019-10-22 ENCOUNTER — Encounter (HOSPITAL_COMMUNITY): Payer: Self-pay

## 2019-10-22 DIAGNOSIS — M5020 Other cervical disc displacement, unspecified cervical region: Secondary | ICD-10-CM

## 2019-10-22 DIAGNOSIS — Z8616 Personal history of COVID-19: Secondary | ICD-10-CM

## 2019-10-22 DIAGNOSIS — M5136 Other intervertebral disc degeneration, lumbar region: Secondary | ICD-10-CM

## 2019-10-22 DIAGNOSIS — M79601 Pain in right arm: Secondary | ICD-10-CM | POA: Diagnosis not present

## 2019-10-22 DIAGNOSIS — M545 Low back pain: Secondary | ICD-10-CM | POA: Diagnosis not present

## 2019-10-22 DIAGNOSIS — M48061 Spinal stenosis, lumbar region without neurogenic claudication: Secondary | ICD-10-CM

## 2019-10-22 DIAGNOSIS — M79604 Pain in right leg: Secondary | ICD-10-CM

## 2019-10-22 DIAGNOSIS — M5030 Other cervical disc degeneration, unspecified cervical region: Secondary | ICD-10-CM

## 2019-10-22 HISTORY — DX: Chronic obstructive pulmonary disease, unspecified: J44.9

## 2019-10-22 LAB — POCT URINALYSIS DIP (DEVICE)
Bilirubin Urine: NEGATIVE
Glucose, UA: NEGATIVE mg/dL
Hgb urine dipstick: NEGATIVE
Ketones, ur: NEGATIVE mg/dL
Leukocytes,Ua: NEGATIVE
Nitrite: NEGATIVE
Protein, ur: NEGATIVE mg/dL
Specific Gravity, Urine: 1.015 (ref 1.005–1.030)
Urobilinogen, UA: 0.2 mg/dL (ref 0.0–1.0)
pH: 5.5 (ref 5.0–8.0)

## 2019-10-22 MED ORDER — METHYLPREDNISOLONE SODIUM SUCC 125 MG IJ SOLR
125.0000 mg | Freq: Once | INTRAMUSCULAR | Status: AC
  Administered 2019-10-22: 125 mg via INTRAMUSCULAR

## 2019-10-22 MED ORDER — METHYLPREDNISOLONE SODIUM SUCC 125 MG IJ SOLR
INTRAMUSCULAR | Status: AC
Start: 2019-10-22 — End: ?
  Filled 2019-10-22: qty 2

## 2019-10-22 NOTE — ED Provider Notes (Signed)
[facility]   MRN: [id number] DOB: 06-Oct-1950  Subjective:   Jeanne Mcclure is a 69 y.o. female presenting for several day history of recurrent right-sided neck pain that radiates into her right arm, low back pain from mid to right side that radiates to her right leg.  Patient states that she has a significant history of back problems and is working with back specialist in [facility].  She states that she has been instructed to receive epidural injections but has not wanted to do this.  She was following up with them consistently but prefers to go somewhere else now.  Was previously taking opiate pain medications but this is no longer helping.  She states that she had COVID-19 in February and her provider refused to do steroid injections as they did before.  Today, she is requesting help with this and a referral for an MRI.  Denies fall, trauma, weakness, changes in bowel habits or urination.  No current facility-administered medications for this encounter.  Current Outpatient Medications:  .  atorvastatin (LIPITOR) 20 MG tablet, Take 20 mg by mouth daily., Disp: , Rfl:  .  cetirizine (ZYRTEC) 10 MG tablet, Take 10 mg by mouth daily., Disp: , Rfl:  .  fluticasone (FLONASE) 50 MCG/ACT nasal spray, Place 2 sprays into the nose daily., Disp: , Rfl:  .  HYDROcodone-acetaminophen (NORCO) 5-325 MG tablet, Take 1 tablet by mouth every 4 (four) hours as needed for moderate pain., Disp: 30 tablet, Rfl: 0 .  ibuprofen (ADVIL,MOTRIN) 600 MG tablet, Take 1 tablet (600 mg total) by mouth every 6 (six) hours as needed for moderate pain., Disp: 30 tablet, Rfl: 0 .  lisinopril (PRINIVIL,ZESTRIL) 10 MG tablet, Take 10 mg by mouth daily., Disp: , Rfl:  .  montelukast (SINGULAIR) 10 MG tablet, Take 10 mg by mouth at bedtime., Disp: , Rfl:  .  potassium chloride (K-DUR,KLOR-CON) 10 MEQ tablet, Take 10 mEq by mouth daily., Disp: , Rfl:  .  simvastatin (ZOCOR) 20 MG tablet, Take 20 mg by mouth every  evening., Disp: , Rfl:  .  traMADol (ULTRAM) 50 MG tablet, Take 1 tablet (50 mg total) by mouth every 6 (six) hours as needed., Disp: 15 tablet, Rfl: 0   Allergies  Allergen Reactions  . Codeine Itching  . Penicillins Itching and Rash    Past Medical History:  Diagnosis Date  . Asthma   . COPD (chronic obstructive pulmonary disease) (HCC)   . COVID-19 virus detected 08/2019  . Hypertension      Past Surgical History:  Procedure Laterality Date  . ROTATOR CUFF REPAIR  2010  . TUBAL LIGATION  1982    Family History  Problem Relation Age of Onset  . Heart failure Mother   . Heart failure Father     Social History   Tobacco Use  . Smoking status: Former Smoker    Packs/day: 0.50    Types: Cigarettes    Quit date: 09/04/2013    Years since quitting: 6.1  . Smokeless tobacco: Never Used  Substance Use Topics  . Alcohol use: No  . Drug use: No    ROS   Objective:   Vitals: BP (!) 157/78 (BP Location: Left Arm)   Pulse (!) 104   Temp 98.7 F (37.1 C) (Oral)   Resp 12   SpO2 98%   Pulse was 93 on recheck by PA-Opie Maclaughlin.   Physical Exam Constitutional:      General: She is not in acute  distress.    Appearance: Normal appearance. She is well-developed. She is not ill-appearing, toxic-appearing or diaphoretic.  HENT:     Head: Normocephalic and atraumatic.     Nose: Nose normal.     Mouth/Throat:     Mouth: Mucous membranes are moist.     Pharynx: Oropharynx is clear.  Eyes:     General: No scleral icterus.    Extraocular Movements: Extraocular movements intact.     Pupils: Pupils are equal, round, and reactive to light.  Cardiovascular:     Rate and Rhythm: Normal rate.  Pulmonary:     Effort: Pulmonary effort is normal.  Musculoskeletal:     Cervical back: No swelling, edema, deformity, erythema, signs of trauma, lacerations, rigidity, spasms, torticollis, tenderness, bony tenderness or crepitus. Pain with movement present. Decreased range of motion (does  not have extremes/full ROM in all directions).     Lumbar back: No swelling, edema, deformity, signs of trauma, lacerations, spasms, tenderness or bony tenderness. Decreased range of motion (does not have extremes/full ROM in all directions worse with bending and flexion). Negative right straight leg raise test and negative left straight leg raise test. No scoliosis.     Comments: Negative Spurling and Lhermitte's signs.  Skin:    General: Skin is warm and dry.  Neurological:     General: No focal deficit present.     Mental Status: She is alert and oriented to person, place, and time.     Cranial Nerves: No cranial nerve deficit.     Motor: No weakness.     Coordination: Coordination normal.     Gait: Gait normal.     Deep Tendon Reflexes: Reflexes normal.  Psychiatric:        Mood and Affect: Mood normal.        Behavior: Behavior normal.        Thought Content: Thought content normal.        Judgment: Judgment normal.     Results for orders placed or performed during the hospital encounter of 10/22/19 (from the past 24 hour(s))  POCT urinalysis dip (device)     Status: None   Collection Time: 10/22/19  6:02 PM  Result Value Ref Range   Glucose, UA NEGATIVE NEGATIVE mg/dL   Bilirubin Urine NEGATIVE NEGATIVE   Ketones, ur NEGATIVE NEGATIVE mg/dL   Specific Gravity, Urine 1.015 1.005 - 1.030   Hgb urine dipstick NEGATIVE NEGATIVE   pH 5.5 5.0 - 8.0   Protein, ur NEGATIVE NEGATIVE mg/dL   Urobilinogen, UA 0.2 0.0 - 1.0 mg/dL   Nitrite NEGATIVE NEGATIVE   Leukocytes,Ua NEGATIVE NEGATIVE    Assessment and Plan :   PDMP not reviewed this encounter.  1. Right arm pain   2. Right leg pain   3. Bulging of cervical intervertebral disc   4. Degenerative disc disease, lumbar   5. Spinal stenosis of lumbar region, unspecified whether neurogenic claudication present   6. History of COVID-19     IM Solu-Medrol.  Counseled patient that she needs a repeat MRI for her current episode  of symptoms.  Provide her with information to [facility] for follow-up.  Also reports that she can try to obtain an MRI from her PCP. Counseled patient on potential for adverse effects with medications prescribed/recommended today, strict ER and return-to-clinic precautions discussed, patient verbalized understanding.    Wallis Bamberg, [place] 10/22/19 1904

## 2019-10-22 NOTE — ED Triage Notes (Signed)
Pt c/o right sided flank pain, left knee pain, right arm, and lower back pain. Patient requesting referral to [facility] for MRI

## 2019-10-22 NOTE — Discharge Instructions (Addendum)
Make sure you follow up with your PCP so that they can order an MRI of your neck and low back.

## 2023-04-19 ENCOUNTER — Other Ambulatory Visit: Payer: Self-pay

## 2023-04-19 ENCOUNTER — Emergency Department (HOSPITAL_COMMUNITY)
Admission: EM | Admit: 2023-04-19 | Discharge: 2023-04-20 | Disposition: A | Discharge status: Home / Self Care | Payer: Medicare HMO | Attending: Emergency Medicine | Admitting: Emergency Medicine

## 2023-04-19 ENCOUNTER — Encounter (HOSPITAL_COMMUNITY): Payer: Self-pay

## 2023-04-19 DIAGNOSIS — R45850 Homicidal ideations: Secondary | ICD-10-CM | POA: Diagnosis not present

## 2023-04-19 DIAGNOSIS — Z639 Problem related to primary support group, unspecified: Secondary | ICD-10-CM

## 2023-04-19 DIAGNOSIS — R45851 Suicidal ideations: Secondary | ICD-10-CM | POA: Diagnosis not present

## 2023-04-19 DIAGNOSIS — F489 Nonpsychotic mental disorder, unspecified: Secondary | ICD-10-CM

## 2023-04-19 DIAGNOSIS — F29 Unspecified psychosis not due to a substance or known physiological condition: Secondary | ICD-10-CM | POA: Diagnosis present

## 2023-04-19 DIAGNOSIS — Z79899 Other long term (current) drug therapy: Secondary | ICD-10-CM | POA: Insufficient documentation

## 2023-04-19 DIAGNOSIS — F22 Delusional disorders: Secondary | ICD-10-CM | POA: Diagnosis present

## 2023-04-19 LAB — CBC
HCT: 34.9 % — ABNORMAL LOW (ref 36.0–46.0)
Hemoglobin: 11.5 g/dL — ABNORMAL LOW (ref 12.0–15.0)
MCH: 31.2 pg (ref 26.0–34.0)
MCHC: 33 g/dL (ref 30.0–36.0)
MCV: 94.6 fL (ref 80.0–100.0)
Platelets: 414 10*3/uL — ABNORMAL HIGH (ref 150–400)
RBC: 3.69 MIL/uL — ABNORMAL LOW (ref 3.87–5.11)
RDW: 15.8 % — ABNORMAL HIGH (ref 11.5–15.5)
WBC: 8.3 10*3/uL (ref 4.0–10.5)
nRBC: 0 % (ref 0.0–0.2)

## 2023-04-19 LAB — ETHANOL: Alcohol, Ethyl (B): <10 mg/dL (ref <10)

## 2023-04-19 LAB — COMPREHENSIVE METABOLIC PANEL
ALT: 56 U/L — ABNORMAL HIGH (ref 0–44)
AST: 132 U/L — ABNORMAL HIGH (ref 15–41)
Albumin: 3.1 g/dL — ABNORMAL LOW (ref 3.5–5.0)
Alkaline Phosphatase: 300 U/L — ABNORMAL HIGH (ref 38–126)
Anion gap: 16 — ABNORMAL HIGH (ref 5–15)
BUN: 9 mg/dL (ref 8–23)
CO2: 22 mmol/L (ref 22–32)
Calcium: 9.4 mg/dL (ref 8.9–10.3)
Chloride: 100 mmol/L (ref 98–111)
Creatinine, Ser: 0.68 mg/dL (ref 0.44–1.00)
GFR, Estimated: >60 mL/min (ref >60)
Glucose, Bld: 92 mg/dL (ref 70–99)
Potassium: 3.6 mmol/L (ref 3.5–5.1)
Sodium: 138 mmol/L (ref 135–145)
Total Bilirubin: 1.4 mg/dL — ABNORMAL HIGH (ref 0.3–1.2)
Total Protein: 6.7 g/dL (ref 6.5–8.1)

## 2023-04-19 LAB — RAPID URINE DRUG SCREEN, HOSP PERFORMED
Amphetamines: NOT DETECTED
Barbiturates: NOT DETECTED
Benzodiazepines: NOT DETECTED
Cocaine: NOT DETECTED
Opiates: NOT DETECTED
Tetrahydrocannabinol: NOT DETECTED

## 2023-04-19 LAB — ACETAMINOPHEN LEVEL: Acetaminophen (Tylenol), Serum: <10 ug/mL — ABNORMAL LOW (ref 10–30)

## 2023-04-19 LAB — SALICYLATE LEVEL: Salicylate Lvl: <7 mg/dL — ABNORMAL LOW (ref 7.0–30.0)

## 2023-04-19 MED ORDER — HALOPERIDOL 5 MG PO TABS: 5.0000 mg | ORAL_TABLET | Freq: Once | ORAL | Status: DC

## 2023-04-19 MED ORDER — STERILE WATER FOR INJECTION IJ SOLN
INTRAMUSCULAR | Status: AC
  Filled 2023-04-19: qty 10

## 2023-04-19 MED ORDER — LORAZEPAM 1 MG PO TABS: 1.0000 mg | ORAL_TABLET | Freq: Once | ORAL | Status: DC

## 2023-04-19 MED ORDER — SIMVASTATIN 20 MG PO TABS: 20.0000 mg | ORAL_TABLET | Freq: Every evening | ORAL | Status: DC

## 2023-04-19 MED ORDER — LORATADINE 10 MG PO TABS: 10.0000 mg | ORAL_TABLET | Freq: Every day | ORAL | Status: DC

## 2023-04-19 MED ORDER — MONTELUKAST SODIUM 10 MG PO TABS
10.0000 mg | ORAL_TABLET | Freq: Every day | ORAL | Status: DC
  Administered 2023-04-19: 10 mg via ORAL
  Filled 2023-04-19: qty 1

## 2023-04-19 MED ORDER — ATORVASTATIN CALCIUM 10 MG PO TABS: 20.0000 mg | ORAL_TABLET | Freq: Every day | ORAL | Status: DC

## 2023-04-19 MED ORDER — ZIPRASIDONE MESYLATE 20 MG IM SOLR
20.0000 mg | Freq: Once | INTRAMUSCULAR | Status: AC
  Administered 2023-04-19: 20 mg via INTRAMUSCULAR
  Filled 2023-04-19: qty 20

## 2023-04-19 MED ORDER — LISINOPRIL 10 MG PO TABS: 10.0000 mg | ORAL_TABLET | Freq: Every day | ORAL | Status: DC

## 2023-04-19 NOTE — BH Assessment (Signed)
TTS requested tele-psychiatry consult with [facility]. Created secure conversation including EDP, Pt's RN, and [facility] Co[facility]ators to facilitate consult. [facility] Coordinator says Pt is scheduled to see Dr Dewitt Rota at 2100.   Pamalee Leyden, [facility], [facility] Triage Specialist .

## 2023-04-19 NOTE — ED Notes (Signed)
Pt keeps on wandering around and cussing at nursing staff. Refused oral meds. MD made aware. Geodon given.

## 2023-04-19 NOTE — ED Notes (Signed)
Belongings in locker #1 in purple zone. ?

## 2023-04-19 NOTE — ED Notes (Signed)
IVC COMPLETE ORIGINAL RED FOLDER 3 COPIES ON CLIPBOARD IN ORANGE ZONE AND ONE IN MEDICAL RECORDS EXP 04/25/23

## 2023-04-19 NOTE — ED Provider Notes (Signed)
[place] EMERGENCY DEPARTMENT AT [facility] Provider Note   CSN: [id number] Arrival date & time: 04/19/23  1129     History  Chief Complaint  Patient presents with   IVC    Jeanne Mcclure is a 72 y.o. female.  Patient here with IVC.  History of bipolar.  She has been talking about hurting herself and others with a gun.  IVC filled by family.  She has no complaints.  She denies any suicidal homicidal ideation with me.  She denies any chest pain weakness numbness tingling.  Nothing makes it worse or better.  She is asking for food.  She denies any abdominal pain nausea vomiting diarrhea.  The history is provided by the patient.       Home Medications Prior to Admission medications   Medication Sig Start Date End Date Taking? Authorizing Provider  atorvastatin (LIPITOR) 20 MG tablet Take 20 mg by mouth daily.    [provider]  cetirizine (ZYRTEC) 10 MG tablet Take 10 mg by mouth daily.    [provider]  fluticasone (FLONASE) 50 MCG/ACT nasal spray Place 2 sprays into the nose daily.    [provider]  HYDROcodone-acetaminophen (NORCO) 5-325 MG tablet Take 1 tablet by mouth every 4 (four) hours as needed for moderate pain. 05/25/16   Charm Rings, MD  ibuprofen (ADVIL,MOTRIN) 600 MG tablet Take 1 tablet (600 mg total) by mouth every 6 (six) hours as needed for moderate pain. 05/25/16   Charm Rings, MD  lisinopril (PRINIVIL,ZESTRIL) 10 MG tablet Take 10 mg by mouth daily.    [provider]  montelukast (SINGULAIR) 10 MG tablet Take 10 mg by mouth at bedtime.    [provider]  potassium chloride (K-DUR,KLOR-CON) 10 MEQ tablet Take 10 mEq by mouth daily.    [provider]  simvastatin (ZOCOR) 20 MG tablet Take 20 mg by mouth every evening.    [provider]  traMADol (ULTRAM) 50 MG tablet Take 1 tablet (50 mg total) by mouth every 6 (six) hours as needed. 11/20/15   Gilda Crease, MD       Allergies    Codeine and Penicillins    Review of Systems   Review of Systems  Physical Exam Updated Vital Signs BP (!) 161/95 (BP Location: Left Arm)   Pulse (!) 129   Temp 99.2 F (37.3 C)   Resp 17   Ht 5\' 5"  (1.651 m)   Wt 72.6 kg   SpO2 97%   BMI 26.63 kg/m  Physical Exam Vitals and nursing note reviewed.  Constitutional:      General: She is not in acute distress.    Appearance: She is well-developed. She is not ill-appearing.  HENT:     Head: Normocephalic and atraumatic.     Mouth/Throat:     Mouth: Mucous membranes are moist.  Eyes:     Extraocular Movements: Extraocular movements intact.     Conjunctiva/sclera: Conjunctivae normal.     Pupils: Pupils are equal, round, and reactive to light.  Cardiovascular:     Rate and Rhythm: Normal rate and regular rhythm.     Pulses: Normal pulses.     Heart sounds: Normal heart sounds. No murmur heard. Pulmonary:     Effort: Pulmonary effort is normal. No respiratory distress.     Breath sounds: Normal breath sounds.  Abdominal:     Palpations: Abdomen is soft.     Tenderness: There is no abdominal  tenderness.  Musculoskeletal:        General: No swelling.     Cervical back: Normal range of motion and neck supple.  Skin:    General: Skin is warm and dry.     Capillary Refill: Capillary refill takes less than 2 seconds.  Neurological:     General: No focal deficit present.     Mental Status: She is alert.  Psychiatric:        Mood and Affect: Mood normal.     ED Results / Procedures / Treatments   Labs (all labs ordered are listed, but only abnormal results are displayed) Labs Reviewed  COMPREHENSIVE METABOLIC PANEL - Abnormal; Notable for the following components:      Result Value   Albumin 3.1 (*)    AST 132 (*)    ALT 56 (*)    Alkaline Phosphatase 300 (*)    Total Bilirubin 1.4 (*)    Anion gap 16 (*)    All other components within normal limits  SALICYLATE LEVEL - Abnormal; Notable for the  following components:   Salicylate Lvl <7.0 (*)    All other components within normal limits  ACETAMINOPHEN LEVEL - Abnormal; Notable for the following components:   Acetaminophen (Tylenol), Serum <10 (*)    All other components within normal limits  CBC - Abnormal; Notable for the following components:   RBC 3.69 (*)    Hemoglobin 11.5 (*)    HCT 34.9 (*)    RDW 15.8 (*)    Platelets 414 (*)    All other components within normal limits  ETHANOL  RAPID URINE DRUG SCREEN, HOSP PERFORMED    EKG None  Radiology No results found.  Procedures Procedures    Medications Ordered in ED Medications  ziprasidone (GEODON) injection 20 mg (has no administration in time range)  atorvastatin (LIPITOR) tablet 20 mg (has no administration in time range)  loratadine (CLARITIN) tablet 10 mg (has no administration in time range)  lisinopril (ZESTRIL) tablet 10 mg (has no administration in time range)  montelukast (SINGULAIR) tablet 10 mg (has no administration in time range)  simvastatin (ZOCOR) tablet 20 mg (has no administration in time range)    ED Course/ Medical Decision Making/ A&P                                 Medical Decision Making Amount and/or Complexity of Data Reviewed Labs: ordered.  Risk Prescription drug management.   Jeanne Mcclure is here with IVC in place.  History of bipolar PTSD.  Per IVC she has been with suicidal homicidal thoughts.  Threatening people with a gun.  She denies those thoughts with me but she is with flight of ideas.  She had screening labs done in triage with per my review interpretation are unremarkable.  She has some nonspecific elevation of her liver enzymes and alk phos but she has no abdominal pain.  Chart review shows sometimes these numbers are elevated in the past.  I have no concern for acute medical problem at this time.  She is medically cleared.  Given Geodon for treatment.  Will have her evaluated by psychiatry.  Medically cleared at this  time.  This chart was dictated using voice recognition software.  Despite best efforts to proofread,  errors can occur which can change the documentation meaning.         Final Clinical Impression(s) / ED Diagnoses Final  diagnoses:  Mental health problem    Rx / DC Orders ED Discharge Orders     None         Virgina Norfolk, DO 04/19/23 1644

## 2023-04-19 NOTE — ED Notes (Signed)
PT ARRIVED AT ED ROOM WITH NO SITTER PRESENT.

## 2023-04-19 NOTE — ED Notes (Signed)
Pt changed to purple scrubs, belongings placed in purple zone lockers.
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
## 2023-04-19 NOTE — ED Provider Triage Note (Signed)
Emergency Medicine Provider Triage Evaluation Note  Vella Albaugh , a 72 y.o. female  was evaluated in triage.  Pt complains of being brought in by police on IVC. Per documentation pt threatening to kill people. She denies this currently. Reports she is going to get 200k at the end of year and people are trying to take it from her..  Review of Systems  Positive: delusions Negative: SI  Physical Exam  BP (!) 161/95 (BP Location: Left Arm)   Pulse (!) 129   Temp 99.2 F (37.3 C)   Resp 17   Ht 5\' 5"  (1.651 m)   Wt 72.6 kg   SpO2 97%   BMI 26.63 kg/m  Gen:   Awake, no distress   Resp:  Normal effort  MSK:   Moves extremities without difficulty  Other:    Medical Decision Making  Medically screening exam initiated at 11:48 AM.  Appropriate orders placed.  Jahnna Crumbaugh was informed that the remainder of the evaluation will be completed by another provider, this initial triage assessment does not replace that evaluation, and the importance of remaining in the ED until their evaluation is complete.    Pete Pelt, [place] 04/19/23 1149

## 2023-04-19 NOTE — ED Notes (Signed)
IVC PAPERWORK ATTACHED TO THE CLIP BOARD IN BLUE ZONE  

## 2023-04-20 ENCOUNTER — Encounter (HOSPITAL_COMMUNITY): Payer: Self-pay | Admitting: Psychiatry

## 2023-04-20 DIAGNOSIS — F29 Unspecified psychosis not due to a substance or known physiological condition: Secondary | ICD-10-CM | POA: Diagnosis not present

## 2023-04-20 DIAGNOSIS — F22 Delusional disorders: Secondary | ICD-10-CM | POA: Diagnosis present

## 2023-04-20 DIAGNOSIS — Z639 Problem related to primary support group, unspecified: Secondary | ICD-10-CM

## 2023-04-20 NOTE — ED Notes (Signed)
Patient states she can get her own ride home and is ready to go.  Patient's daughter notified patient has been discharged. A&O x 4. Ambulatory with steady gait. No signs of distress.

## 2023-04-20 NOTE — ED Provider Notes (Signed)
Emergency Medicine Observation Re-evaluation Note  Bexli Hodgson is a 72 y.o. female, seen on rounds today.  Pt initially presented to the ED for complaints of IVC Currently, the patient is awake, she just took a shower.  She has no complaints  Physical Exam  BP (!) 105/57 (BP Location: Right Arm)   Pulse 96   Temp 98.9 F (37.2 C) (Oral)   Resp 17   Ht 5\' 5"  (1.651 m)   Wt 72.6 kg   SpO2 100%   BMI 26.63 kg/m  Physical Exam General: No acute distress Cardiac: Regular rate Lungs: No respiratory distress Psych: Currently calm  ED Course / MDM  EKG:   I have reviewed the labs performed to date as well as medications administered while in observation.  Recent changes in the last 24 hours include -patient assessed by psychiatry team.  They have cleared her.  They have requested that we rescind the IVC.  They have provided patient with appropriate resources.  Patient aware of the plan.  I confirmed with her that she will be discharged.  Plan  Current plan is for discharge with shelter resources.    Derwood Kaplan, MD 04/20/23 [phone]

## 2023-04-20 NOTE — Discharge Instructions (Signed)
Safety Plan Jeanne Mcclure will reach out to Daughter, Jeanne Mcclure, call 911 or call mobile crisis, or go to nearest emergency room if condition worsens or if suicidal/homicidal thoughts become appreciable Patients' will follow up with mental health services for outpatient if/when needed and or amenable.  The suicide prevention education provided includes the following: Suicide risk factors Suicide prevention and interventions National Suicide Hotline telephone number [facility] assessment telephone number [facility] Emergency Assistance 911 [facility] and/or Residential Mobile Crisis Unit telephone number

## 2023-04-20 NOTE — ED Notes (Addendum)
Spoke with patient's daughter. Daughter trying to determine if patient's granddaughter will allow her to come back to her house or not. Daughter unable to drive to get her. Daughter to call back to let us know if we can get her a cab to her house or not.
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
## 2023-04-27 ENCOUNTER — Other Ambulatory Visit: Payer: Self-pay

## 2023-04-27 ENCOUNTER — Emergency Department (HOSPITAL_COMMUNITY): Payer: Medicare HMO

## 2023-04-27 ENCOUNTER — Inpatient Hospital Stay (HOSPITAL_COMMUNITY)
Admission: EM | Admit: 2023-04-27 | Discharge: 2023-05-07 | DRG: 872 | Discharge status: Against Medical Advice | Payer: Medicare HMO | Attending: Family Medicine | Admitting: Family Medicine

## 2023-04-27 DIAGNOSIS — F39 Unspecified mood [affective] disorder: Secondary | ICD-10-CM | POA: Insufficient documentation

## 2023-04-27 DIAGNOSIS — E876 Hypokalemia: Secondary | ICD-10-CM | POA: Diagnosis present

## 2023-04-27 DIAGNOSIS — I10 Essential (primary) hypertension: Secondary | ICD-10-CM | POA: Insufficient documentation

## 2023-04-27 DIAGNOSIS — G893 Neoplasm related pain (acute) (chronic): Secondary | ICD-10-CM | POA: Diagnosis present

## 2023-04-27 DIAGNOSIS — Z880 Allergy status to penicillin: Secondary | ICD-10-CM | POA: Diagnosis not present

## 2023-04-27 DIAGNOSIS — C787 Secondary malignant neoplasm of liver and intrahepatic bile duct: Secondary | ICD-10-CM | POA: Diagnosis present

## 2023-04-27 DIAGNOSIS — Z91010 Allergy to peanuts: Secondary | ICD-10-CM | POA: Diagnosis not present

## 2023-04-27 DIAGNOSIS — F17210 Nicotine dependence, cigarettes, uncomplicated: Secondary | ICD-10-CM | POA: Diagnosis present

## 2023-04-27 DIAGNOSIS — R6520 Severe sepsis without septic shock: Secondary | ICD-10-CM | POA: Diagnosis present

## 2023-04-27 DIAGNOSIS — Z885 Allergy status to narcotic agent status: Secondary | ICD-10-CM

## 2023-04-27 DIAGNOSIS — K5732 Diverticulitis of large intestine without perforation or abscess without bleeding: Secondary | ICD-10-CM | POA: Diagnosis present

## 2023-04-27 DIAGNOSIS — Z91041 Radiographic dye allergy status: Secondary | ICD-10-CM | POA: Diagnosis not present

## 2023-04-27 DIAGNOSIS — M1990 Unspecified osteoarthritis, unspecified site: Secondary | ICD-10-CM | POA: Diagnosis present

## 2023-04-27 DIAGNOSIS — K5792 Diverticulitis of intestine, part unspecified, without perforation or abscess without bleeding: Secondary | ICD-10-CM | POA: Diagnosis not present

## 2023-04-27 DIAGNOSIS — A419 Sepsis, unspecified organism: Secondary | ICD-10-CM | POA: Diagnosis present

## 2023-04-27 DIAGNOSIS — Z7189 Other specified counseling: Secondary | ICD-10-CM | POA: Diagnosis not present

## 2023-04-27 DIAGNOSIS — J4489 Other specified chronic obstructive pulmonary disease: Secondary | ICD-10-CM | POA: Diagnosis present

## 2023-04-27 DIAGNOSIS — Z8616 Personal history of COVID-19: Secondary | ICD-10-CM | POA: Diagnosis not present

## 2023-04-27 DIAGNOSIS — Z91012 Allergy to eggs: Secondary | ICD-10-CM | POA: Diagnosis not present

## 2023-04-27 DIAGNOSIS — C801 Malignant (primary) neoplasm, unspecified: Secondary | ICD-10-CM

## 2023-04-27 DIAGNOSIS — E872 Acidosis, unspecified: Secondary | ICD-10-CM | POA: Diagnosis present

## 2023-04-27 DIAGNOSIS — Z66 Do not resuscitate: Secondary | ICD-10-CM | POA: Diagnosis not present

## 2023-04-27 DIAGNOSIS — E11649 Type 2 diabetes mellitus with hypoglycemia without coma: Secondary | ICD-10-CM | POA: Diagnosis present

## 2023-04-27 DIAGNOSIS — K5909 Other constipation: Secondary | ICD-10-CM | POA: Diagnosis present

## 2023-04-27 DIAGNOSIS — F319 Bipolar disorder, unspecified: Secondary | ICD-10-CM | POA: Diagnosis present

## 2023-04-27 DIAGNOSIS — D649 Anemia, unspecified: Secondary | ICD-10-CM | POA: Diagnosis present

## 2023-04-27 DIAGNOSIS — Z79899 Other long term (current) drug therapy: Secondary | ICD-10-CM

## 2023-04-27 DIAGNOSIS — Z8249 Family history of ischemic heart disease and other diseases of the circulatory system: Secondary | ICD-10-CM

## 2023-04-27 DIAGNOSIS — Z515 Encounter for palliative care: Secondary | ICD-10-CM | POA: Diagnosis not present

## 2023-04-27 DIAGNOSIS — Z91018 Allergy to other foods: Secondary | ICD-10-CM

## 2023-04-27 LAB — CBC WITH DIFFERENTIAL/PLATELET
Abs Immature Granulocytes: 0.04 10*3/uL (ref 0.00–0.07)
Basophils Absolute: 0 10*3/uL (ref 0.0–0.1)
Basophils Relative: 0 %
Eosinophils Absolute: 0 10*3/uL (ref 0.0–0.5)
Eosinophils Relative: 0 %
HCT: 32.3 % — ABNORMAL LOW (ref 36.0–46.0)
Hemoglobin: 10.8 g/dL — ABNORMAL LOW (ref 12.0–15.0)
Immature Granulocytes: 1 %
Lymphocytes Relative: 16 %
Lymphs Abs: 1.3 10*3/uL (ref 0.7–4.0)
MCH: 31.9 pg (ref 26.0–34.0)
MCHC: 33.4 g/dL (ref 30.0–36.0)
MCV: 95.3 fL (ref 80.0–100.0)
Monocytes Absolute: 0.5 10*3/uL (ref 0.1–1.0)
Monocytes Relative: 6 %
Neutro Abs: 6.5 10*3/uL (ref 1.7–7.7)
Neutrophils Relative %: 77 %
Platelets: 452 10*3/uL — ABNORMAL HIGH (ref 150–400)
RBC: 3.39 MIL/uL — ABNORMAL LOW (ref 3.87–5.11)
RDW: 17 % — ABNORMAL HIGH (ref 11.5–15.5)
WBC: 8.3 10*3/uL (ref 4.0–10.5)
nRBC: 0 % (ref 0.0–0.2)

## 2023-04-27 LAB — TROPONIN I (HIGH SENSITIVITY)
Troponin I (High Sensitivity): 5 ng/L (ref <18)
Troponin I (High Sensitivity): 5 ng/L (ref <18)

## 2023-04-27 LAB — I-STAT CHEM 8, ED
BUN: 7 mg/dL — ABNORMAL LOW (ref 8–23)
Calcium, Ion: 1.11 mmol/L — ABNORMAL LOW (ref 1.15–1.40)
Chloride: 99 mmol/L (ref 98–111)
Creatinine, Ser: 0.4 mg/dL — ABNORMAL LOW (ref 0.44–1.00)
Glucose, Bld: 81 mg/dL (ref 70–99)
HCT: 34 % — ABNORMAL LOW (ref 36.0–46.0)
Hemoglobin: 11.6 g/dL — ABNORMAL LOW (ref 12.0–15.0)
Potassium: 3.4 mmol/L — ABNORMAL LOW (ref 3.5–5.1)
Sodium: 137 mmol/L (ref 135–145)
TCO2: 24 mmol/L (ref 22–32)

## 2023-04-27 LAB — BASIC METABOLIC PANEL
Anion gap: 15 (ref 5–15)
BUN: 10 mg/dL (ref 8–23)
CO2: 23 mmol/L (ref 22–32)
Calcium: 9.3 mg/dL (ref 8.9–10.3)
Chloride: 98 mmol/L (ref 98–111)
Creatinine, Ser: 0.45 mg/dL (ref 0.44–1.00)
GFR, Estimated: >60 mL/min (ref >60)
Glucose, Bld: 83 mg/dL (ref 70–99)
Potassium: 3.3 mmol/L — ABNORMAL LOW (ref 3.5–5.1)
Sodium: 136 mmol/L (ref 135–145)

## 2023-04-27 LAB — LACTIC ACID, PLASMA
Lactic Acid, Venous: 3.2 mmol/L (ref 0.5–1.9)
Lactic Acid, Venous: 3.4 mmol/L (ref 0.5–1.9)

## 2023-04-27 LAB — HEPATIC FUNCTION PANEL
ALT: 57 U/L — ABNORMAL HIGH (ref 0–44)
AST: 152 U/L — ABNORMAL HIGH (ref 15–41)
Albumin: 3 g/dL — ABNORMAL LOW (ref 3.5–5.0)
Alkaline Phosphatase: 278 U/L — ABNORMAL HIGH (ref 38–126)
Bilirubin, Direct: 1.9 mg/dL — ABNORMAL HIGH (ref 0.0–0.2)
Indirect Bilirubin: 1.7 mg/dL — ABNORMAL HIGH (ref 0.3–0.9)
Total Bilirubin: 3.6 mg/dL — ABNORMAL HIGH (ref 0.3–1.2)
Total Protein: 6.6 g/dL (ref 6.5–8.1)

## 2023-04-27 LAB — URINALYSIS, ROUTINE W REFLEX MICROSCOPIC
Bacteria, UA: NONE SEEN
Bilirubin Urine: NEGATIVE
Glucose, UA: NEGATIVE mg/dL
Ketones, ur: 20 mg/dL — AB
Leukocytes,Ua: NEGATIVE
Nitrite: NEGATIVE
Protein, ur: 30 mg/dL — AB
Specific Gravity, Urine: 1.018 (ref 1.005–1.030)
pH: 5 (ref 5.0–8.0)

## 2023-04-27 LAB — I-STAT CG4 LACTIC ACID, ED: Lactic Acid, Venous: 4 mmol/L (ref 0.5–1.9)

## 2023-04-27 LAB — LIPASE, BLOOD: Lipase: 21 U/L (ref 11–51)

## 2023-04-27 LAB — MAGNESIUM: Magnesium: 1.9 mg/dL (ref 1.7–2.4)

## 2023-04-27 MED ORDER — GADOBUTROL 1 MMOL/ML IV SOLN
7.0000 mL | Freq: Once | INTRAVENOUS | Status: AC | PRN
Start: 2023-04-27 — End: 2023-04-27
  Administered 2023-04-27: 7 mL via INTRAVENOUS

## 2023-04-27 MED ORDER — ALBUTEROL SULFATE HFA 108 (90 BASE) MCG/ACT IN AERS: 2.0000 | INHALATION_SPRAY | RESPIRATORY_TRACT | Status: DC | PRN

## 2023-04-27 MED ORDER — LACTATED RINGERS IV BOLUS
500.0000 mL | Freq: Once | INTRAVENOUS | Status: AC
  Administered 2023-04-28: 500 mL via INTRAVENOUS

## 2023-04-27 MED ORDER — LACTATED RINGERS IV BOLUS
500.0000 mL | Freq: Once | INTRAVENOUS | Status: AC
  Administered 2023-04-27: 500 mL via INTRAVENOUS

## 2023-04-27 MED ORDER — ALBUTEROL SULFATE (2.5 MG/3ML) 0.083% IN NEBU: 2.5000 mg | INHALATION_SOLUTION | RESPIRATORY_TRACT | Status: DC | PRN

## 2023-04-27 MED ORDER — SODIUM CHLORIDE 0.9% FLUSH
3.0000 mL | Freq: Two times a day (BID) | INTRAVENOUS | Status: DC
Start: 2023-04-27 — End: 2023-05-07
  Administered 2023-04-28 – 2023-05-02 (×7): 3 mL via INTRAVENOUS

## 2023-04-27 MED ORDER — POTASSIUM CHLORIDE CRYS ER 20 MEQ PO TBCR
40.0000 meq | EXTENDED_RELEASE_TABLET | Freq: Once | ORAL | Status: AC
  Administered 2023-04-27: 40 meq via ORAL
  Filled 2023-04-27: qty 2

## 2023-04-27 MED ORDER — FENTANYL CITRATE PF 50 MCG/ML IJ SOSY
50.0000 ug | PREFILLED_SYRINGE | Freq: Once | INTRAMUSCULAR | Status: AC
  Administered 2023-04-27: 50 ug via INTRAVENOUS
  Filled 2023-04-27: qty 1

## 2023-04-27 MED ORDER — ACETAMINOPHEN 500 MG PO TABS: 1000.0000 mg | ORAL_TABLET | Freq: Four times a day (QID) | ORAL | Status: DC | PRN

## 2023-04-27 MED ORDER — OXYCODONE HCL 5 MG PO TABS
2.5000 mg | ORAL_TABLET | Freq: Four times a day (QID) | ORAL | Status: DC | PRN
  Administered 2023-04-28: 2.5 mg via ORAL
  Filled 2023-04-27: qty 1

## 2023-04-27 MED ORDER — ONDANSETRON HCL 4 MG/2ML IJ SOLN
4.0000 mg | Freq: Four times a day (QID) | INTRAMUSCULAR | Status: DC | PRN
  Administered 2023-04-27: 4 mg via INTRAVENOUS
  Filled 2023-04-27: qty 2

## 2023-04-27 MED ORDER — PIPERACILLIN-TAZOBACTAM 3.375 G IVPB
3.3750 g | Freq: Three times a day (TID) | INTRAVENOUS | Status: DC
  Administered 2023-04-28 – 2023-05-02 (×13): 3.375 g via INTRAVENOUS
  Filled 2023-04-27 (×15): qty 50

## 2023-04-27 MED ORDER — FENTANYL CITRATE PF 50 MCG/ML IJ SOSY: 50.0000 ug | PREFILLED_SYRINGE | INTRAMUSCULAR | Status: DC | PRN

## 2023-04-27 MED ORDER — PIPERACILLIN-TAZOBACTAM 3.375 G IVPB 30 MIN
3.3750 g | Freq: Once | INTRAVENOUS | Status: AC
  Administered 2023-04-27: 3.375 g via INTRAVENOUS
  Filled 2023-04-27: qty 50

## 2023-04-27 MED ORDER — MONTELUKAST SODIUM 10 MG PO TABS
10.0000 mg | ORAL_TABLET | Freq: Every day | ORAL | Status: DC
  Administered 2023-04-28 – 2023-05-06 (×7): 10 mg via ORAL
  Filled 2023-04-27 (×10): qty 1

## 2023-04-27 MED ORDER — OXYCODONE HCL 5 MG PO TABS
5.0000 mg | ORAL_TABLET | Freq: Four times a day (QID) | ORAL | Status: DC | PRN
  Administered 2023-04-28 – 2023-05-01 (×5): 5 mg via ORAL
  Filled 2023-04-27 (×5): qty 1

## 2023-04-27 MED ORDER — OXYCODONE HCL 5 MG PO TABS: 2.5000 mg | ORAL_TABLET | Freq: Four times a day (QID) | ORAL | Status: DC | PRN

## 2023-04-27 NOTE — ED Provider Notes (Signed)
[facility] Provider Note   CSN: [id number] Arrival date & time: 04/27/23  1635     History  Chief Complaint  Patient presents with   Abdominal Pain   Flank Pain    Jeanne Mcclure is a 72 y.o. female.  HPI Patient presents for abdominal pain.  Medical history includes COPD, HTN.  She was seen in the ED a week ago for suicidal and homicidal threats.  She was evaluated and cleared by psychiatry team.  When she left, she did not want to go back to live with her daughter.  She was provided with shelter resources.  Since her recent visit, she has moved into her own apartment.  She denies any suicidal or homicidal thoughts.  For the past several weeks, she has had intermittent right upper quadrant abdominal pain.  This has recently worsened.  Because of the pain, she has not ate or drink anything since yesterday.  She has not taken her home medications.  She denies any recent vomiting.  Pain radiates around right flank and into right back.  She received 100 mcg of fentanyl prior to arrival.  Current pain is 5/10 in severity.    Home Medications Prior to Admission medications   Medication Sig Start Date End Date Taking? Authorizing Provider  albuterol (VENTOLIN HFA) 108 (90 Base) MCG/ACT inhaler Inhale 2 puffs into the lungs every 4 (four) hours as needed for shortness of breath. 07/18/22   [provider]  Azelastine HCl 137 MCG/SPRAY SOLN Place 2 sprays into both nostrils 2 (two) times daily. 08/20/22   [provider]  cyclobenzaprine (FLEXERIL) 10 MG tablet Take 10 mg by mouth daily. 01/26/20   [provider]  diphenhydrAMINE (BENADRYL) 25 mg capsule Take 25 mg by mouth every 8 (eight) hours as needed for sleep.    [provider]  EPINEPHrine 0.3 mg/0.3 mL IJ SOAJ injection Inject 0.3 mg into the muscle as needed for anaphylaxis. 05/06/19   [provider]  fluticasone (FLONASE) 50 MCG/ACT nasal spray  Place 2 sprays into the nose daily.    [provider]  montelukast (SINGULAIR) 10 MG tablet Take 10 mg by mouth at bedtime.    [provider]  Potassium Bicarb-Citric Acid 20 MEQ TBEF Take 1 tablet by mouth daily.    [provider]      Allergies    Hydrocodone; Peanut-containing drug products; Codeine; Egg solids, whole; Fruit extracts; Iodine; and Penicillins    Review of Systems   Review of Systems  Constitutional:  Positive for appetite change.  Gastrointestinal:  Positive for abdominal pain.  Genitourinary:  Positive for flank pain.  Musculoskeletal:  Positive for back pain.  All other systems reviewed and are negative.   Physical Exam Updated Vital Signs BP (!) 157/86 (BP Location: Right Arm)   Pulse (!) 114   Temp 98.3 F (36.8 C) (Oral)   Resp 14   Ht 5\' 5"  (1.651 m)   Wt 74.8 kg   SpO2 94%   BMI 27.46 kg/m  Physical Exam Vitals and nursing note reviewed.  Constitutional:      General: She is not in acute distress.    Appearance: Normal appearance. She is well-developed. She is not ill-appearing, toxic-appearing or diaphoretic.  HENT:     Head: Normocephalic and atraumatic.     Right Ear: External ear normal.     Left Ear: External ear normal.     Nose: Nose normal.  Mouth/Throat:     Mouth: Mucous membranes are moist.  Eyes:     Conjunctiva/sclera: Conjunctivae normal.  Cardiovascular:     Rate and Rhythm: Normal rate and regular rhythm.     Heart sounds: No murmur heard. Pulmonary:     Effort: Pulmonary effort is normal. No respiratory distress.     Breath sounds: Normal breath sounds. No wheezing or rales.  Chest:     Chest wall: No tenderness.  Abdominal:     Palpations: Abdomen is soft.     Tenderness: There is abdominal tenderness in the right upper quadrant. There is no guarding or rebound.  Musculoskeletal:        General: No swelling. Normal range of motion.     Cervical back: Normal range of motion and neck  supple.  Skin:    General: Skin is warm and dry.     Coloration: Skin is not jaundiced or pale.  Neurological:     General: No focal deficit present.     Mental Status: She is alert and oriented to person, place, and time.  Psychiatric:        Mood and Affect: Mood normal. Affect is flat.        Speech: Speech normal.        Behavior: Behavior is slowed and withdrawn. Behavior is cooperative.        Thought Content: Thought content normal. Thought content does not include homicidal or suicidal ideation.     ED Results / Procedures / Treatments   Labs (all labs ordered are listed, but only abnormal results are displayed) Labs Reviewed  CBC WITH DIFFERENTIAL/PLATELET - Abnormal; Notable for the following components:      Result Value   RBC 3.39 (*)    Hemoglobin 10.8 (*)    HCT 32.3 (*)    RDW 17.0 (*)    Platelets 452 (*)    All other components within normal limits  BASIC METABOLIC PANEL - Abnormal; Notable for the following components:   Potassium 3.3 (*)    All other components within normal limits  HEPATIC FUNCTION PANEL - Abnormal; Notable for the following components:   Albumin 3.0 (*)    AST 152 (*)    ALT 57 (*)    Alkaline Phosphatase 278 (*)    Total Bilirubin 3.6 (*)    Bilirubin, Direct 1.9 (*)    Indirect Bilirubin 1.7 (*)    All other components within normal limits  LACTIC ACID, PLASMA - Abnormal; Notable for the following components:   Lactic Acid, Venous 3.2 (*)    All other components within normal limits  LACTIC ACID, PLASMA - Abnormal; Notable for the following components:   Lactic Acid, Venous 3.4 (*)    All other components within normal limits  I-STAT CG4 LACTIC ACID, ED - Abnormal; Notable for the following components:   Lactic Acid, Venous 4.0 (*)    All other components within normal limits  I-STAT CHEM 8, ED - Abnormal; Notable for the following components:   Potassium 3.4 (*)    BUN 7 (*)    Creatinine, Ser 0.40 (*)    Calcium, Ion 1.11  (*)    Hemoglobin 11.6 (*)    HCT 34.0 (*)    All other components within normal limits  LIPASE, BLOOD  MAGNESIUM  URINALYSIS, ROUTINE W REFLEX MICROSCOPIC  TROPONIN I (HIGH SENSITIVITY)  TROPONIN I (HIGH SENSITIVITY)    EKG EKG Interpretation Date/Time:  Sunday April 27 2023 17:01:15 EDT  Ventricular Rate:  118 PR Interval:  144 QRS Duration:  70 QT Interval:  328 QTC Calculation: 460 R Axis:   17  Text Interpretation: Sinus tachycardia Low voltage, precordial leads Borderline T abnormalities, anterior leads Confirmed by Gloris Manchester [phone]) on 04/27/2023 5:41:24 PM  Radiology CT CHEST ABDOMEN PELVIS WO CONTRAST  Result Date: 04/27/2023 CLINICAL DATA:  Unintended weight loss, right upper quadrant pain and flank pain for several days. Dark colored urine. EXAM: CT CHEST, ABDOMEN AND PELVIS WITHOUT CONTRAST TECHNIQUE: Multidetector CT imaging of the chest, abdomen and pelvis was performed following the standard protocol without IV contrast. RADIATION DOSE REDUCTION: This exam was performed according to the departmental dose-optimization program which includes automated exposure control, adjustment of the mA and/or kV according to patient size and/or use of iterative reconstruction technique. COMPARISON:  Same day ultrasound FINDINGS: CT CHEST FINDINGS Cardiovascular: No pericardial effusion. Coronary artery and aortic atherosclerotic calcification. Normal caliber thoracic aorta. Mediastinum/Nodes: Trachea and esophagus are unremarkable. No thoracic adenopathy noting decreased sensitivity on noncontrast exam. Lungs/Pleura: Bibasilar atelectasis/scarring. Trace right pleural effusion. No pneumothorax. Musculoskeletal: No acute fracture or destructive osseous lesion. CT ABDOMEN PELVIS FINDINGS Hepatobiliary: The liver is enlarged and contains multiple poorly defined hypoattenuating lesions. These are incompletely evaluated without IV contrast. Small amount of subcapsular fluid about the anterior  right hepatic lobe. Contracted gallbladder. No biliary dilation. Pancreas: Unremarkable. No pancreatic ductal dilatation or surrounding inflammatory changes. Spleen: Unremarkable. Adrenals/Urinary Tract: Normal adrenal glands and kidneys. Unremarkable bladder. Stomach/Bowel: Normal caliber large and small bowel. Stranding and fluid about a diverticulum at the hepatic flexure (circa series 8/image 50.) normal appendix. Stomach is within normal limits. Vascular/Lymphatic: No evidence of lymphadenopathy on noncontrast exam. Aortic atherosclerotic calcification. Reproductive: Calcified uterine fibroids. Prominent ovaries are normal assessed without contrast. Other: Small volume perihepatic and pelvic free fluid. No free intraperitoneal air. Musculoskeletal: No acute fracture or destructive osseous lesion. Age related spondylosis. Grade 1 anterolisthesis of L4. No destructive osseous lesion. Degenerative changes at the pubic symphysis. IMPRESSION: 1. Hepatomegaly with multiple poorly defined hypoattenuating lesions. These are incompletely evaluated without IV contrast but are concerning for malignancy. Recommend further evaluation with liver protocol MRI. 2. Acute uncomplicated diverticulitis at the hepatic flexure. 3. Small volume perihepatic and pelvic free fluid. Aortic Atherosclerosis (ICD10-I70.0). Electronically Signed   By: Minerva Fester M.D.   On: 04/27/2023 22:11   US Abdomen Limited RUQ (LIVER/GB)  Result Date: 04/27/2023 CLINICAL DATA:  Right upper quadrant pain for 2 weeks EXAM: ULTRASOUND ABDOMEN LIMITED RIGHT UPPER QUADRANT COMPARISON:  None Available. FINDINGS: Gallbladder: Gallbladder is contracted, with nonspecific gallbladder wall thickening measuring up to 5 mm. No evidence of cholelithiasis. Negative sonographic Murphy sign. Common bile duct: Diameter: 2 mm Liver: Evaluation of the liver parenchyma is limited due to respiratory motion. Liver is enlarged and heterogeneous, with multiple  heterogeneous hyperechoic masslike areas identified. Largest in the right lobe measures 5.4 cm, and these masslike areas displaces the hepatic vasculature and do not demonstrate any significant increased Doppler flow. Dedicated liver CT is recommended for further evaluation. Portal vein is patent on color Doppler imaging with normal direction of blood flow towards the liver. Other: None. IMPRESSION: 1. Gallbladder is contracted, limiting its evaluation. Gallbladder wall thickening measuring up to 5 mm is a nonspecific finding, with no other sonographic evidence of cholelithiasis or cholecystitis. 2. Diffuse heterogeneity throughout the liver parenchyma, with hyperechoic masslike areas as above. Sonographic appearance is nonspecific, and dedicated liver CT with without IV contrast is recommended. Electronically Signed  By: Sharlet Salina M.D.   On: 04/27/2023 17:51    Procedures Procedures    Medications Ordered in ED Medications  ondansetron (ZOFRAN) injection 4 mg (4 mg Intravenous Given 04/27/23 1719)  fentaNYL (SUBLIMAZE) injection 50 mcg (has no administration in time range)  piperacillin-tazobactam (ZOSYN) IVPB 3.375 g (has no administration in time range)  potassium chloride SA (KLOR-CON M) CR tablet 40 mEq (has no administration in time range)  lactated ringers bolus 500 mL (has no administration in time range)  lactated ringers bolus 500 mL (0 mLs Intravenous Stopped 04/27/23 1826)  fentaNYL (SUBLIMAZE) injection 50 mcg (50 mcg Intravenous Given 04/27/23 1719)  lactated ringers bolus 500 mL (500 mLs Intravenous New Bag/Given 04/27/23 2233)    ED Course/ Medical Decision Making/ A&P                                 Medical Decision Making Amount and/or Complexity of Data Reviewed Labs: ordered. Radiology: ordered.  Risk Prescription drug management.   This patient presents to the ED for concern of abdominal pain, this involves an extensive number of treatment options, and is a  complaint that carries with it a high risk of complications and morbidity.  The differential diagnosis includes cholecystitis, cholangitis, neoplasm, hepatitis, gastritis, nephrolithiasis, pneumonia   Co morbidities that complicate the patient evaluation  COPD, HTN   Additional history obtained:  Additional history obtained from N/A External records from outside source obtained and reviewed including EMR   Lab Tests:  I Ordered, and personally interpreted labs.  The pertinent results include: Concern for cholestasis with greater than 2X increase in bilirubin over the past week.  Transaminases and alkaline phosphatase are consistent with prior lab work.  Kidney function and electrolytes are normal other than some mild hypokalemia.  No leukocytosis is present.  Lactic acid is elevated.   Imaging Studies ordered:  I ordered imaging studies including right upper quadrant ultrasound, CT of chest, abdomen, pelvis, MR/MRCP of abdomen I independently visualized and interpreted imaging which showed uncomplicated diverticulitis at hepatic flexure, liver lesions concerning for malignancy I agree with the radiologist interpretation   Cardiac Monitoring: / EKG:  The patient was maintained on a cardiac monitor.  I personally viewed and interpreted the cardiac monitored which showed an underlying rhythm of: Sinus rhythm   Consultations Obtained:  I requested consultation with the gastroenterologist, Dr. Levora Angel,  and discussed lab and imaging findings as well as pertinent plan - they recommend: MRI/MRCP, admission to medicine.  GI will see in consult.   Problem List / ED Course / Critical interventions / Medication management  Patient presenting for right upper quadrant pain that she states has been intermittent over the past several weeks but recently worsened since yesterday.  She arrives via EMS.  She did receive 100 mcg of fentanyl prior to arrival.  She has ongoing 5/10 severity pain.   On exam, she is overall well-appearing.  She does have right quadrant tenderness.  Fentanyl was ordered for ongoing analgesia.  Given her p.o. intolerance, IV fluids were ordered for hydration.  Workup was initiated.  Lab work was notable for lactic acidosis and increasing bilirubin when compared to lab work from a week ago.  This raises concern for cholestasis.  Right a quadrant ultrasound did not show evidence of cholecystitis.  On CT of chest, abdomen, pelvis, there were liver lesions concerning for malignancy.  Patient also interestingly has what appears  to be uncomplicated diverticulitis at hepatic flexure.  Zosyn was ordered.  I spoke with gastroenterologist on-call, Dr. Levora Angel, who agrees with MRI/MRCP.  This was ordered.  On reassessment, patient's pain is controlled.  She remains tachycardic.  Additional IV fluids were ordered.  Patient was admitted for further management. I ordered medication including IV fluids for hydration; Zosyn for diverticulitis and possibly biliary disease; fentanyl for analgesia; potassium chloride for hypokalemia Reevaluation of the patient after these medicines showed that the patient improved I have reviewed the patients home medicines and have made adjustments as needed   Social Determinants of Health:  Lives independently        Final Clinical Impression(s) / ED Diagnoses Final diagnoses:  Hyperbilirubinemia  Diverticulitis  Hypokalemia    Rx / DC Orders ED Discharge Orders     None         Gloris Manchester, MD 04/27/23 2237

## 2023-04-27 NOTE — ED Triage Notes (Signed)
Pt BIBA from home. C/o RUQ abd pain and flank pain for several days. Has been having dark coloured urine.  Aox4  Given 100 mcg Fent by EMS, pain from 10 down to 5

## 2023-04-28 ENCOUNTER — Encounter (HOSPITAL_COMMUNITY): Payer: Self-pay | Admitting: Internal Medicine

## 2023-04-28 DIAGNOSIS — A419 Sepsis, unspecified organism: Principal | ICD-10-CM

## 2023-04-28 LAB — CBC
HCT: 30.8 % — ABNORMAL LOW (ref 36.0–46.0)
Hemoglobin: 10.3 g/dL — ABNORMAL LOW (ref 12.0–15.0)
MCH: 32.4 pg (ref 26.0–34.0)
MCHC: 33.4 g/dL (ref 30.0–36.0)
MCV: 96.9 fL (ref 80.0–100.0)
Platelets: 439 10*3/uL — ABNORMAL HIGH (ref 150–400)
RBC: 3.18 MIL/uL — ABNORMAL LOW (ref 3.87–5.11)
RDW: 17.3 % — ABNORMAL HIGH (ref 11.5–15.5)
WBC: 8.5 10*3/uL (ref 4.0–10.5)
nRBC: 0 % (ref 0.0–0.2)

## 2023-04-28 LAB — HEPATIC FUNCTION PANEL
ALT: 53 U/L — ABNORMAL HIGH (ref 0–44)
AST: 153 U/L — ABNORMAL HIGH (ref 15–41)
Albumin: 2.8 g/dL — ABNORMAL LOW (ref 3.5–5.0)
Alkaline Phosphatase: 237 U/L — ABNORMAL HIGH (ref 38–126)
Bilirubin, Direct: 2.3 mg/dL — ABNORMAL HIGH (ref 0.0–0.2)
Indirect Bilirubin: 1.8 mg/dL — ABNORMAL HIGH (ref 0.3–0.9)
Total Bilirubin: 4.1 mg/dL — ABNORMAL HIGH (ref 0.3–1.2)
Total Protein: 6.2 g/dL — ABNORMAL LOW (ref 6.5–8.1)

## 2023-04-28 LAB — BASIC METABOLIC PANEL
Anion gap: 13 (ref 5–15)
BUN: 8 mg/dL (ref 8–23)
CO2: 23 mmol/L (ref 22–32)
Calcium: 9.1 mg/dL (ref 8.9–10.3)
Chloride: 100 mmol/L (ref 98–111)
Creatinine, Ser: 0.55 mg/dL (ref 0.44–1.00)
GFR, Estimated: >60 mL/min (ref >60)
Glucose, Bld: 65 mg/dL — ABNORMAL LOW (ref 70–99)
Potassium: 4 mmol/L (ref 3.5–5.1)
Sodium: 136 mmol/L (ref 135–145)

## 2023-04-28 LAB — GLUCOSE, CAPILLARY
Glucose-Capillary: 134 mg/dL — ABNORMAL HIGH (ref 70–99)
Glucose-Capillary: 69 mg/dL — ABNORMAL LOW (ref 70–99)
Glucose-Capillary: 80 mg/dL (ref 70–99)

## 2023-04-28 LAB — PHOSPHORUS: Phosphorus: 3.4 mg/dL (ref 2.5–4.6)

## 2023-04-28 LAB — LACTIC ACID, PLASMA
Lactic Acid, Venous: 3.1 mmol/L (ref 0.5–1.9)
Lactic Acid, Venous: 2.9 mmol/L (ref 0.5–1.9)

## 2023-04-28 LAB — PROTIME-INR
INR: 1.2 (ref 0.8–1.2)
Prothrombin Time: 15.2 s (ref 11.4–15.2)

## 2023-04-28 LAB — MAGNESIUM: Magnesium: 1.7 mg/dL (ref 1.7–2.4)

## 2023-04-28 MED ORDER — METOPROLOL TARTRATE 25 MG PO TABS
12.5000 mg | ORAL_TABLET | Freq: Two times a day (BID) | ORAL | Status: DC
Start: 2023-04-28 — End: 2023-05-06
  Administered 2023-04-28 – 2023-05-05 (×13): 12.5 mg via ORAL
  Filled 2023-04-28 (×16): qty 1

## 2023-04-28 MED ORDER — LACTATED RINGERS IV BOLUS
1000.0000 mL | Freq: Once | INTRAVENOUS | Status: AC
Start: 2023-04-28 — End: 2023-04-28
  Administered 2023-04-28: 1000 mL via INTRAVENOUS

## 2023-04-28 MED ORDER — INSULIN ASPART 100 UNIT/ML IJ SOLN: 0.0000 [IU] | Freq: Three times a day (TID) | INTRAMUSCULAR | Status: DC

## 2023-04-28 MED ORDER — BOOST / RESOURCE BREEZE PO LIQD CUSTOM
1.0000 | Freq: Three times a day (TID) | ORAL | Status: DC
  Administered 2023-04-28 – 2023-05-01 (×2): 1 via ORAL

## 2023-04-28 MED ORDER — GLUCOSE 4 G PO CHEW
1.0000 | CHEWABLE_TABLET | Freq: Once | ORAL | Status: AC
  Administered 2023-04-28: 4 g via ORAL
  Filled 2023-04-28: qty 1

## 2023-04-28 MED ORDER — GABAPENTIN 100 MG PO CAPS
200.0000 mg | ORAL_CAPSULE | Freq: Every day | ORAL | Status: DC
  Administered 2023-04-28 – 2023-05-06 (×6): 200 mg via ORAL
  Filled 2023-04-28 (×9): qty 2

## 2023-04-28 NOTE — Hospital Course (Addendum)
  Brief Narrative:  Jeanne Mcclure is a 72 year old female with hypertension diabetes COPD/asthma,DJD, bipolar disorder who presented to the hospital 04/27/2023 with complaints of right upper quadrant abdominal pain, flank pain as well as dark-colored urine. MRCP showed>Severe hepatomegaly with infiltrative pattern of innumerable hypovascular lesions throughout the liver, presumably reflective of widespread metastatic disease. No definite primary source confidently identified.  Patient underwent ultrasound-guided biopsy on 10/30.  Biopsy results are still pending but patient has voiced that does not want any further cancer treatment therefore oncology and palliative care service has signed off for now.  Patient does remain DNR/DNI.  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  MRI brain is negative.  Liver biopsy from 10/30 results are still pending.  Patient has voiced that she does not want to pursue any further medical treatments therefore oncology team has signed off.  Patient is also been seen by palliative care service.    Acute diverticulitis, severe sepsis POA -Zosyn --> Augmentin to complete 10-day course.  EOT 11/6   Hypertension Sinus tachycardia -Increase Metoprolol.  IV as needed   Diabetes mellitus -SSI    Mood disorder Continue gabapentin.  Recently had suicidal ideation, cleared by psychiatry     DVT prophylaxis:  SCDs Start: 04/27/23 2324   Code Status: DNR, no CPR, DNI Family Communication: None at bedside Disposition Plan: SNF placement pending Status is: Inpatient Remains inpatient appropriate because: SNF placement pending    Subjective: No complaints.    Examination:   General exam: Appears calm and comfortable  Respiratory system: Clear to auscultation. Respiratory effort normal. No respiratory distress. No conversational dyspnea.  Cardiovascular system: S1 & S2 heard, RRR. No murmurs. No pedal edema. Gastrointestinal system: Abdomen is nondistended, soft and nontender. Normal bowel sounds heard. Central nervous system: Alert and oriented. No focal neurological deficits. Speech clear.  Extremities: Symmetric in appearance  Skin: No rashes, lesions or ulcers on exposed skin  Psychiatry: Judgement and insight appear normal. Mood & affect appropriate.

## 2023-04-28 NOTE — ED Notes (Signed)
..ED TO INPATIENT HANDOFF REPORT  Name/Age/Gender Jeanne Mcclure 72 y.o. female  Code Status    Code Status Orders  (From admission, onward)           Start     Ordered   04/27/23 2325  Full code  Continuous       Question:  By:  Answer:  Consent: discussion documented in EHR   04/27/23 2332           Code Status History     Date Active Date Inactive Code Status Order ID Comments User Context   04/19/2023 1645 04/20/2023 1830 Full Code [id number]  Virgina Norfolk, DO ED       Home/SNF/Other Home  Chief Complaint Sepsis [facility]) [A41.9]  Level of Care/Admitting Diagnosis ED Disposition     ED Disposition  Admit   Condition  --   Comment  Hospital Area: [facility] [[id number]]  Level of Care: Telemetry [5]  Admit to tele based on following criteria: Other see comments  Comments: sepsis  May admit patient to [facility] or [facility] if equivalent level of care is available:: No  Covid Evaluation: Asymptomatic - no recent exposure (last 10 days) testing not required  Diagnosis: Sepsis [facility]) [[id number]]  Admitting Physician: Dolly Rias [[id number]]  Attending Physician: Dolly Rias [[id number]]  Certification:: I certify this patient will need inpatient services for at least 2 midnights  Expected Medical Readiness: 04/30/2023          Medical History Past Medical History:  Diagnosis Date   Asthma    COPD (chronic obstructive pulmonary disease) (HCC)    COVID-19 virus detected 08/2019   Hypertension     Allergies Allergies  Allergen Reactions   Hydrocodone Itching   Peanut-Containing Drug Products Itching    Mouth and throats itches   Codeine Itching   Egg Solids, Whole    Fruit Extracts    Iodine    Penicillins Itching and Rash    IV Location/Drains/Wounds Patient Lines/Drains/Airways Status     Active Line/Drains/Airways     Name Placement date Placement time Site Days   Peripheral IV 04/27/23 20 G Left;Posterior  Hand 04/27/23  1719  Hand  1   Peripheral IV 04/27/23 20 G Posterior;Right Wrist 04/27/23  2240  Wrist  1            Labs/Imaging Results for orders placed or performed during the hospital encounter of 04/27/23 (from the past 48 hour(s))  Lipase, blood     Status: None   Collection Time: 04/27/23  5:19 PM  Result Value Ref Range   Lipase 21 11 - 51 U/L    Comment: Performed at [facility], [address] [place], [place] [address]  CBC with Diff     Status: Abnormal   Collection Time: 04/27/23  5:19 PM  Result Value Ref Range   WBC 8.3 4.0 - 10.5 K/uL   RBC 3.39 (L) 3.87 - 5.11 MIL/uL   Hemoglobin 10.8 (L) 12.0 - 15.0 g/dL   HCT 96.2 (L) 95.2 - 84.1 %   MCV 95.3 80.0 - 100.0 fL   MCH 31.9 26.0 - 34.0 pg   MCHC 33.4 30.0 - 36.0 g/dL   RDW 32.4 (H) 40.1 - 02.7 %   Platelets 452 (H) 150 - 400 K/uL   nRBC 0.0 0.0 - 0.2 %   Neutrophils Relative % 77 %   Neutro Abs 6.5 1.7 - 7.7 K/uL   Lymphocytes Relative 16 %  Lymphs Abs 1.3 0.7 - 4.0 K/uL   Monocytes Relative 6 %   Monocytes Absolute 0.5 0.1 - 1.0 K/uL   Eosinophils Relative 0 %   Eosinophils Absolute 0.0 0.0 - 0.5 K/uL   Basophils Relative 0 %   Basophils Absolute 0.0 0.0 - 0.1 K/uL   Immature Granulocytes 1 %   Abs Immature Granulocytes 0.04 0.00 - 0.07 K/uL    Comment: Performed at [facility], [address] [place], [place] [address]  Basic metabolic panel     Status: Abnormal   Collection Time: 04/27/23  5:19 PM  Result Value Ref Range   Sodium 136 135 - 145 mmol/L   Potassium 3.3 (L) 3.5 - 5.1 mmol/L   Chloride 98 98 - 111 mmol/L   CO2 23 22 - 32 mmol/L   Glucose, Bld 83 70 - 99 mg/dL    Comment: Glucose reference range applies only to samples taken after fasting for at least 8 hours.   BUN 10 8 - 23 mg/dL   Creatinine, Ser 6.04 0.44 - 1.00 mg/dL   Calcium 9.3 8.9 - 54.0 mg/dL   GFR, Estimated >98 >11 mL/min    Comment: (NOTE) Calculated using the CKD-EPI  Creatinine Equation (2021)    Anion gap 15 5 - 15    Comment: Performed at [facility], [address], [place], [place] [address]  Hepatic function panel     Status: Abnormal   Collection Time: 04/27/23  5:19 PM  Result Value Ref Range   Total Protein 6.6 6.5 - 8.1 g/dL   Albumin 3.0 (L) 3.5 - 5.0 g/dL   AST 295 (H) 15 - 41 U/L   ALT 57 (H) 0 - 44 U/L   Alkaline Phosphatase 278 (H) 38 - 126 U/L   Total Bilirubin 3.6 (H) 0.3 - 1.2 mg/dL   Bilirubin, Direct 1.9 (H) 0.0 - 0.2 mg/dL   Indirect Bilirubin 1.7 (H) 0.3 - 0.9 mg/dL    Comment: Performed at [facility], [address] [place], [place] [address]  Magnesium     Status: None   Collection Time: 04/27/23  5:19 PM  Result Value Ref Range   Magnesium 1.9 1.7 - 2.4 mg/dL    Comment: Performed at [facility], [address] [place], [place] [address]  Troponin I (High Sensitivity)     Status: None   Collection Time: 04/27/23  5:19 PM  Result Value Ref Range   Troponin I (High Sensitivity) 5 <18 ng/L    Comment: (NOTE) Elevated high sensitivity troponin I (hsTnI) values and significant  changes across serial measurements may suggest ACS but many other  chronic and acute conditions are known to elevate hsTnI results.  Refer to the "Links" section for chest pain algorithms and additional  guidance. Performed at [facility], [address] [place], [place] [address]   I-Stat Lactic Acid, ED     Status: Abnormal   Collection Time: 04/27/23  5:38 PM  Result Value Ref Range   Lactic Acid, Venous 4.0 (HH) 0.5 - 1.9 mmol/L   Comment NOTIFIED PHYSICIAN   I-Stat Chem 8, ED     Status: Abnormal   Collection Time: 04/27/23  5:39 PM  Result Value Ref Range   Sodium 137 135 - 145 mmol/L   Potassium 3.4 (L) 3.5 - 5.1 mmol/L   Chloride 99 98 - 111 mmol/L   BUN 7 (L) 8 - 23 mg/dL  Creatinine, Ser 0.40 (L) 0.44 - 1.00 mg/dL   Glucose, Bld 81 70 - 99 mg/dL     Comment: Glucose reference range applies only to samples taken after fasting for at least 8 hours.   Calcium, Ion 1.11 (L) 1.15 - 1.40 mmol/L   TCO2 24 22 - 32 mmol/L   Hemoglobin 11.6 (L) 12.0 - 15.0 g/dL   HCT 16.1 (L) 09.6 - 04.5 %  Troponin I (High Sensitivity)     Status: None   Collection Time: 04/27/23  6:52 PM  Result Value Ref Range   Troponin I (High Sensitivity) 5 <18 ng/L    Comment: (NOTE) Elevated high sensitivity troponin I (hsTnI) values and significant  changes across serial measurements may suggest ACS but many other  chronic and acute conditions are known to elevate hsTnI results.  Refer to the "Links" section for chest pain algorithms and additional  guidance. Performed at [facility], [address], [place], [place] [address]   Lactic acid, plasma     Status: Abnormal   Collection Time: 04/27/23  6:52 PM  Result Value Ref Range   Lactic Acid, Venous 3.2 (HH) 0.5 - 1.9 mmol/L    Comment: CRITICAL RESULT CALLED TO, READ BACK BY AND VERIFIED WITH METCALF, A RN @ 1950 04/27/23. GILBERTL Performed at [facility], [address] [place], [place] [address]   Lactic acid, plasma     Status: Abnormal   Collection Time: 04/27/23  8:36 PM  Result Value Ref Range   Lactic Acid, Venous 3.4 (HH) 0.5 - 1.9 mmol/L    Comment: CRITICAL VALUE NOTED. VALUE IS CONSISTENT WITH PREVIOUSLY REPORTED/CALLED VALUE Performed at [facility], [address] [place], [place] [address]   Urinalysis, Routine w reflex microscopic -Urine, Clean Catch     Status: Abnormal   Collection Time: 04/27/23 10:50 PM  Result Value Ref Range   Color, Urine AMBER (A) YELLOW    Comment: BIOCHEMICALS MAY BE AFFECTED BY COLOR   APPearance CLEAR CLEAR   Specific Gravity, Urine 1.018 1.005 - 1.030   pH 5.0 5.0 - 8.0   Glucose, UA NEGATIVE NEGATIVE mg/dL   Hgb urine dipstick MODERATE (A) NEGATIVE   Bilirubin Urine NEGATIVE NEGATIVE    Ketones, ur 20 (A) NEGATIVE mg/dL   Protein, ur 30 (A) NEGATIVE mg/dL   Nitrite NEGATIVE NEGATIVE   Leukocytes,Ua NEGATIVE NEGATIVE   RBC / HPF 6-10 0 - 5 RBC/hpf   WBC, UA 0-5 0 - 5 WBC/hpf   Bacteria, UA NONE SEEN NONE SEEN   Squamous Epithelial / HPF 0-5 0 - 5 /HPF   Mucus PRESENT     Comment: Performed at [facility], [address] [place], [place] [address]  Lactic acid, plasma     Status: Abnormal   Collection Time: 04/28/23 12:39 AM  Result Value Ref Range   Lactic Acid, Venous 3.1 (HH) 0.5 - 1.9 mmol/L    Comment: CRITICAL VALUE NOTED. VALUE IS CONSISTENT WITH PREVIOUSLY REPORTED/CALLED VALUE Performed at [facility], [address], [place], [place] [address]    CT CHEST ABDOMEN PELVIS WO CONTRAST  Result Date: 04/27/2023 CLINICAL DATA:  Unintended weight loss, right upper quadrant pain and flank pain for several days. Dark colored urine. EXAM: CT CHEST, ABDOMEN AND PELVIS WITHOUT CONTRAST TECHNIQUE: Multidetector CT imaging of the chest, abdomen and pelvis was performed following the standard protocol without IV contrast. RADIATION DOSE REDUCTION: This exam was performed  according to the departmental dose-optimization program which includes automated exposure control, adjustment of the mA and/or kV according to patient size and/or use of iterative reconstruction technique. COMPARISON:  Same day ultrasound FINDINGS: CT CHEST FINDINGS Cardiovascular: No pericardial effusion. Coronary artery and aortic atherosclerotic calcification. Normal caliber thoracic aorta. Mediastinum/Nodes: Trachea and esophagus are unremarkable. No thoracic adenopathy noting decreased sensitivity on noncontrast exam. Lungs/Pleura: Bibasilar atelectasis/scarring. Trace right pleural effusion. No pneumothorax. Musculoskeletal: No acute fracture or destructive osseous lesion. CT ABDOMEN PELVIS FINDINGS Hepatobiliary: The liver is enlarged and contains multiple poorly defined  hypoattenuating lesions. These are incompletely evaluated without IV contrast. Small amount of subcapsular fluid about the anterior right hepatic lobe. Contracted gallbladder. No biliary dilation. Pancreas: Unremarkable. No pancreatic ductal dilatation or surrounding inflammatory changes. Spleen: Unremarkable. Adrenals/Urinary Tract: Normal adrenal glands and kidneys. Unremarkable bladder. Stomach/Bowel: Normal caliber large and small bowel. Stranding and fluid about a diverticulum at the hepatic flexure (circa series 8/image 50.) normal appendix. Stomach is within normal limits. Vascular/Lymphatic: No evidence of lymphadenopathy on noncontrast exam. Aortic atherosclerotic calcification. Reproductive: Calcified uterine fibroids. Prominent ovaries are normal assessed without contrast. Other: Small volume perihepatic and pelvic free fluid. No free intraperitoneal air. Musculoskeletal: No acute fracture or destructive osseous lesion. Age related spondylosis. Grade 1 anterolisthesis of L4. No destructive osseous lesion. Degenerative changes at the pubic symphysis. IMPRESSION: 1. Hepatomegaly with multiple poorly defined hypoattenuating lesions. These are incompletely evaluated without IV contrast but are concerning for malignancy. Recommend further evaluation with liver protocol MRI. 2. Acute uncomplicated diverticulitis at the hepatic flexure. 3. Small volume perihepatic and pelvic free fluid. Aortic Atherosclerosis (ICD10-I70.0). Electronically Signed   By: Minerva Fester M.D.   On: 04/27/2023 22:11   US Abdomen Limited RUQ (LIVER/GB)  Result Date: 04/27/2023 CLINICAL DATA:  Right upper quadrant pain for 2 weeks EXAM: ULTRASOUND ABDOMEN LIMITED RIGHT UPPER QUADRANT COMPARISON:  None Available. FINDINGS: Gallbladder: Gallbladder is contracted, with nonspecific gallbladder wall thickening measuring up to 5 mm. No evidence of cholelithiasis. Negative sonographic Murphy sign. Common bile duct: Diameter: 2 mm Liver:  Evaluation of the liver parenchyma is limited due to respiratory motion. Liver is enlarged and heterogeneous, with multiple heterogeneous hyperechoic masslike areas identified. Largest in the right lobe measures 5.4 cm, and these masslike areas displaces the hepatic vasculature and do not demonstrate any significant increased Doppler flow. Dedicated liver CT is recommended for further evaluation. Portal vein is patent on color Doppler imaging with normal direction of blood flow towards the liver. Other: None. IMPRESSION: 1. Gallbladder is contracted, limiting its evaluation. Gallbladder wall thickening measuring up to 5 mm is a nonspecific finding, with no other sonographic evidence of cholelithiasis or cholecystitis. 2. Diffuse heterogeneity throughout the liver parenchyma, with hyperechoic masslike areas as above. Sonographic appearance is nonspecific, and dedicated liver CT with without IV contrast is recommended. Electronically Signed   By: Sharlet Salina M.D.   On: 04/27/2023 17:51    Pending Labs Unresulted Labs (From admission, onward)     Start     Ordered   04/28/23 0500  Basic metabolic panel  Tomorrow morning,   R        04/27/23 2332   04/28/23 0500  CBC  Tomorrow morning,   R        04/27/23 2332   04/28/23 0500  Magnesium  Tomorrow morning,   R        04/27/23 2332   04/28/23 0500  Phosphorus  Tomorrow morning,   R  04/27/23 2332   04/28/23 0500  Hepatic function panel  Tomorrow morning,   R        04/27/23 2332   04/28/23 0500  Protime-INR  Tomorrow morning,   R        04/27/23 2332   04/28/23 0500  Lactic acid, plasma  (Lactic Acid)  ONCE - STAT,   STAT        04/28/23 0122            Vitals/Pain Today's Vitals   04/27/23 2115 04/27/23 2130 04/28/23 0052 04/28/23 0137  BP:   (!) 162/83   Pulse: (!) 114 (!) 114 (!) 116   Resp: (!) 31 14 18    Temp:   97.7 F (36.5 C)   TempSrc:   Oral   SpO2: 94% 94% 95%   Weight:      Height:      PainSc:    1      Isolation Precautions No active isolations  Medications Medications  ondansetron (ZOFRAN) injection 4 mg (4 mg Intravenous Given 04/27/23 1719)  sodium chloride flush (NS) 0.9 % injection 3 mL (3 mLs Intravenous Given 04/28/23 0019)  acetaminophen (TYLENOL) tablet 1,000 mg (has no administration in time range)  piperacillin-tazobactam (ZOSYN) IVPB 3.375 g (has no administration in time range)  montelukast (SINGULAIR) tablet 10 mg (10 mg Oral Patient Refused/Not Given 04/28/23 0021)  oxyCODONE (Oxy IR/ROXICODONE) immediate release tablet 2.5 mg ( Oral See Alternative 04/28/23 0011)    Or  oxyCODONE (Oxy IR/ROXICODONE) immediate release tablet 5 mg (5 mg Oral Given 04/28/23 0011)  albuterol (PROVENTIL) (2.5 MG/3ML) 0.083% nebulizer solution 2.5 mg (has no administration in time range)  gabapentin (NEURONTIN) capsule 200 mg (200 mg Oral Patient Refused/Not Given 04/28/23 0135)  lactated ringers bolus 500 mL (0 mLs Intravenous Stopped 04/27/23 1826)  fentaNYL (SUBLIMAZE) injection 50 mcg (50 mcg Intravenous Given 04/27/23 1719)  lactated ringers bolus 500 mL (0 mLs Intravenous Stopped 04/28/23 0135)  piperacillin-tazobactam (ZOSYN) IVPB 3.375 g (0 g Intravenous Stopped 04/28/23 0019)  potassium chloride SA (KLOR-CON M) CR tablet 40 mEq (40 mEq Oral Given 04/27/23 2245)  lactated ringers bolus 500 mL (0 mLs Intravenous Stopped 04/28/23 0135)  gadobutrol (GADAVIST) 1 MMOL/ML injection 7 mL (7 mLs Intravenous Contrast Given 04/27/23 2316)  lactated ringers bolus 500 mL (0 mLs Intravenous Stopped 04/28/23 0242)  lactated ringers bolus 1,000 mL (1,000 mLs Intravenous New Bag/Given 04/28/23 0135)    Mobility walks

## 2023-04-28 NOTE — Progress Notes (Signed)
PROGRESS NOTE Jeanne Mcclure  LKG:[id number] DOB: 02-Nov-1950 DOA: 04/27/2023 PCP: Oneita Hurt, No  Brief Narrative/Hospital Course: 72 year old female with hypertension diabetes COPD/asthma,DJD, bipolar disorder recent ED visits in 10/19 with suicidal ideation cleared by psychiatry presented from home with complaint of right upper quadrant abdominal pain flank pain for several days and having dark-colored urine, patient has been dealing with intermittent right upper quadrant abdominal pain over the past year and had been persistent since past few weeks.    Subjective: Seen and examined, Overnight vitals/labs/events reviewed  Some RUQ abd pain, passing faltus, no nausea vomiting chest pain Taking clears   Assessment and Plan: Principal Problem:   Sepsis (HCC)   Intrahepatic cholestasis-Transaminitis/elevated T. bili Multiple hypodense liver lesions suspected metastasis ?Diverticulitis at the hepatic flexure Severe sepsis secondary to above POA: Presented with tachycardia tachypnea and lactic acidosis and met severe sepsis criteria  Likely from intrahepatic cholestasis. Lactic acid downtrending 2.9 last  one.  STILL tachycardia but BP stable afebrile overnight. Follow-up MRCP result.  Dr Elnoria Howard is aware and notified.continue clear diet for now. Continue on Zosyn, follow-up blood cultures,  cont pain control antiemetics. Ivf held 2/2 large volume resuscitation , bp stable although has been tachycardic. Recent Labs  Lab 04/27/23 1719 04/27/23 1738 04/27/23 1852 04/27/23 2036 04/28/23 0039 04/28/23 0559  WBC 8.3  --   --   --   --  8.5  LATICACIDVEN  --  4.0* 3.2* 3.4* 3.1* 2.9*    Normocytic anemia: hemoglobin at 10.3 g, monitor. Recent Labs  Lab 04/27/23 1719 04/27/23 1739 04/28/23 0559  HGB 10.8* 11.6* 10.3*  HCT 32.3* 34.0* 30.8*   HTN: BP is controlled.  Holding antihypertensives Lasix lisinopril, added low dose metoprolol with holding parameters  Diabetes mellitus diet  controlled Hypoglycemia 65 in labs: Check CBG q 4h,Monitor blood sugar, ordered glucose tab. No results for input(s): "GLUCAP", "HGBA1C" in the last 168 hours.   Bipolar disorder: Mood is stable.  Not on meds.  Recent episode of suicidal ideation and was cleared by psychiatry.  DVT prophylaxis: SCDs Start: 04/27/23 2324 Code Status:   Code Status: Full Code Family Communication: plan of care discussed with patient at bedside. Patient status is: Inpatient because of sepsis, liver lesion Level of care: Telemetry   Dispo: The patient is from: HOME            Anticipated disposition: TBD  Objective: Vitals last 24 hrs: Vitals:   04/28/23 0545 04/28/23 0654 04/28/23 0944 04/28/23 1030  BP: (!) 143/82  (!) 166/91 (!) 141/78  Pulse: (!) 106  (!) 116 (!) 108  Resp: 14  18 18   Temp: 98.6 F (37 C)  97.8 F (36.6 C)   TempSrc: Oral  Oral   SpO2: 96%  98% 100%  Weight:  73.8 kg    Height:  5\' 5"  (1.651 m)     Weight change:  Physical Examination: General exam: alert awake, older than stated age HEENT:Oral mucosa moist, Ear/Nose WNL grossly Respiratory system: bilaterally clear BS, no use of accessory muscle Cardiovascular system: S1 & S2 +, No JVD. Gastrointestinal system: Abdomen soft,Tender RUQ,ND, BS+ Nervous System:Alert, awake, moving extremities. Extremities: LE edema mild,distal peripheral pulses palpable.  Skin: No rashes,no icterus. MSK: Normal muscle bulk,tone, power  Medications reviewed: Scheduled Meds:  gabapentin  200 mg Oral QHS   metoprolol tartrate  12.5 mg Oral BID   montelukast  10 mg Oral QHS   sodium chloride flush  3 mL Intravenous Q12H   Continuous Infusions:  piperacillin-tazobactam (ZOSYN)  IV 3.375 g (04/28/23 9629)    Diet Order             Diet clear liquid Room service appropriate? Yes; Fluid consistency: Thin  Diet effective now                  No intake or output data in the 24 hours ending 04/28/23 1053 Net IO Since Admission: No  IO data has been entered for this period [04/28/23 1053]  Wt Readings from Last 3 Encounters:  04/28/23 73.8 kg  04/19/23 72.6 kg     Unresulted Labs (From admission, onward)    None     Data Reviewed: I have personally reviewed following labs and imaging studies CBC: Recent Labs  Lab 04/27/23 1719 04/27/23 1739 04/28/23 0559  WBC 8.3  --  8.5  NEUTROABS 6.5  --   --   HGB 10.8* 11.6* 10.3*  HCT 32.3* 34.0* 30.8*  MCV 95.3  --  96.9  PLT 452*  --  439*   Basic Metabolic Panel: Recent Labs  Lab 04/27/23 1719 04/27/23 1739 04/28/23 0559  NA 136 137 136  K 3.3* 3.4* 4.0  CL 98 99 100  CO2 23  --  23  GLUCOSE 83 81 65*  BUN 10 7* 8  CREATININE 0.45 0.40* 0.55  CALCIUM 9.3  --  9.1  MG 1.9  --  1.7  PHOS  --   --  3.4   GFR: Estimated Creatinine Clearance: 64.9 mL/min (by C-G formula based on SCr of 0.55 mg/dL). Liver Function Tests: Recent Labs  Lab 04/27/23 1719 04/28/23 0559  AST 152* 153*  ALT 57* 53*  ALKPHOS 278* 237*  BILITOT 3.6* 4.1*  PROT 6.6 6.2*  ALBUMIN 3.0* 2.8*   Recent Labs  Lab 04/27/23 1719  LIPASE 21   No results for input(s): "AMMONIA" in the last 168 hours. Coagulation Profile: Recent Labs  Lab 04/28/23 0559  INR 1.2  Sepsis Labs: Recent Labs  Lab 04/27/23 1852 04/27/23 2036 04/28/23 0039 04/28/23 0559  LATICACIDVEN 3.2* 3.4* 3.1* 2.9*    No results found for this or any previous visit (from the past 240 hour(s)).  Antimicrobials: Anti-infectives (From admission, onward)    Start     Dose/Rate Route Frequency Ordered Stop   04/28/23 0600  piperacillin-tazobactam (ZOSYN) IVPB 3.375 g        3.375 g 12.5 mL/hr over 240 Minutes Intravenous Every 8 hours 04/27/23 2332     04/27/23 2245  piperacillin-tazobactam (ZOSYN) IVPB 3.375 g        3.375 g 100 mL/hr over 30 Minutes Intravenous  Once 04/27/23 2230 04/28/23 0019      Culture/Microbiology No results found for: "SDES", "SPECREQUEST", "CULT", "REPTSTATUS"    Radiology Studies: CT CHEST ABDOMEN PELVIS WO CONTRAST  Result Date: 04/27/2023 CLINICAL DATA:  Unintended weight loss, right upper quadrant pain and flank pain for several days. Dark colored urine. EXAM: CT CHEST, ABDOMEN AND PELVIS WITHOUT CONTRAST TECHNIQUE: Multidetector CT imaging of the chest, abdomen and pelvis was performed following the standard protocol without IV contrast. RADIATION DOSE REDUCTION: This exam was performed according to the departmental dose-optimization program which includes automated exposure control, adjustment of the mA and/or kV according to patient size and/or use of iterative reconstruction technique. COMPARISON:  Same day ultrasound FINDINGS: CT CHEST FINDINGS Cardiovascular: No pericardial effusion. Coronary artery and aortic atherosclerotic calcification. Normal caliber thoracic aorta. Mediastinum/Nodes: Trachea and esophagus are unremarkable. No thoracic adenopathy  noting decreased sensitivity on noncontrast exam. Lungs/Pleura: Bibasilar atelectasis/scarring. Trace right pleural effusion. No pneumothorax. Musculoskeletal: No acute fracture or destructive osseous lesion. CT ABDOMEN PELVIS FINDINGS Hepatobiliary: The liver is enlarged and contains multiple poorly defined hypoattenuating lesions. These are incompletely evaluated without IV contrast. Small amount of subcapsular fluid about the anterior right hepatic lobe. Contracted gallbladder. No biliary dilation. Pancreas: Unremarkable. No pancreatic ductal dilatation or surrounding inflammatory changes. Spleen: Unremarkable. Adrenals/Urinary Tract: Normal adrenal glands and kidneys. Unremarkable bladder. Stomach/Bowel: Normal caliber large and small bowel. Stranding and fluid about a diverticulum at the hepatic flexure (circa series 8/image 50.) normal appendix. Stomach is within normal limits. Vascular/Lymphatic: No evidence of lymphadenopathy on noncontrast exam. Aortic atherosclerotic calcification. Reproductive:  Calcified uterine fibroids. Prominent ovaries are normal assessed without contrast. Other: Small volume perihepatic and pelvic free fluid. No free intraperitoneal air. Musculoskeletal: No acute fracture or destructive osseous lesion. Age related spondylosis. Grade 1 anterolisthesis of L4. No destructive osseous lesion. Degenerative changes at the pubic symphysis. IMPRESSION: 1. Hepatomegaly with multiple poorly defined hypoattenuating lesions. These are incompletely evaluated without IV contrast but are concerning for malignancy. Recommend further evaluation with liver protocol MRI. 2. Acute uncomplicated diverticulitis at the hepatic flexure. 3. Small volume perihepatic and pelvic free fluid. Aortic Atherosclerosis (ICD10-I70.0). Electronically Signed   By: Minerva Fester M.D.   On: 04/27/2023 22:11   US Abdomen Limited RUQ (LIVER/GB)  Result Date: 04/27/2023 CLINICAL DATA:  Right upper quadrant pain for 2 weeks EXAM: ULTRASOUND ABDOMEN LIMITED RIGHT UPPER QUADRANT COMPARISON:  None Available. FINDINGS: Gallbladder: Gallbladder is contracted, with nonspecific gallbladder wall thickening measuring up to 5 mm. No evidence of cholelithiasis. Negative sonographic Murphy sign. Common bile duct: Diameter: 2 mm Liver: Evaluation of the liver parenchyma is limited due to respiratory motion. Liver is enlarged and heterogeneous, with multiple heterogeneous hyperechoic masslike areas identified. Largest in the right lobe measures 5.4 cm, and these masslike areas displaces the hepatic vasculature and do not demonstrate any significant increased Doppler flow. Dedicated liver CT is recommended for further evaluation. Portal vein is patent on color Doppler imaging with normal direction of blood flow towards the liver. Other: None. IMPRESSION: 1. Gallbladder is contracted, limiting its evaluation. Gallbladder wall thickening measuring up to 5 mm is a nonspecific finding, with no other sonographic evidence of cholelithiasis  or cholecystitis. 2. Diffuse heterogeneity throughout the liver parenchyma, with hyperechoic masslike areas as above. Sonographic appearance is nonspecific, and dedicated liver CT with without IV contrast is recommended. Electronically Signed   By: Sharlet Salina M.D.   On: 04/27/2023 17:51     LOS: 1 day   Lanae Boast, MD [facility]  04/28/2023, 10:53 AM

## 2023-04-28 NOTE — Evaluation (Addendum)
Physical Therapy Evaluation Patient Details Name: Jeanne Mcclure MRN: [id number] DOB: 1951/02/27 Today's Date: 04/28/2023  History of Present Illness  72 y.o. female with hx hypertension, diabetes, COPD, degenerative arthritis, bipolar disorder, recent ED visit 10/19 with suicidal ideation ultimately cleared by psychiatry, who presents due to persistent right upper quadrant pain.  Found to have multiple hypodense liver lesions, suspected metastases with unknown primary.  Septic likely from intrahepatic cholestasis.  Clinical Impression  Pt admitted with above diagnosis. Pt ambulated 46' (35' with RW, then 56' without a device), HR 116, no loss of balance. Distance limited by RUQ pain. Pt reports she has no assistance available at home, she rents a 2nd floor room, she moved here in June to assist her daughter who has cancer. She's mobilizing well, she's requesting a rollator to help navigate longer distances and is interested in ALF, TOC notified.  If she DCs home, she'll need to be able to ascend/descend a flight of stairs. Pt currently with functional limitations due to the deficits listed below (see PT Problem List). Pt will benefit from acute skilled PT to increase their independence and safety with mobility to allow discharge.           If plan is discharge home, recommend the following: Assist for transportation   Can travel by private vehicle        Equipment Recommendations Rollator (4 wheels)  Recommendations for Other Services       Functional Status Assessment Patient has had a recent decline in their functional status and demonstrates the ability to make significant improvements in function in a reasonable and predictable amount of time.     Precautions / Restrictions Precautions Precautions: Fall Precaution Comments: denies falls in past 6 months Restrictions Weight Bearing Restrictions: No      Mobility  Bed Mobility Overal bed mobility: Modified Independent              General bed mobility comments: HOB up, used rail    Transfers Overall transfer level: Independent   Transfers: Sit to/from Stand Sit to Stand: Independent                Ambulation/Gait Ambulation/Gait assistance: Supervision Gait Distance (Feet): 70 Feet Assistive device: Rolling walker (2 wheels), None   Gait velocity: decr     General Gait Details: 29' with RW, then 55' without AD, no loss of balance, HR 116; no loss of balance, distance limited by pain  Stairs            Wheelchair Mobility     Tilt Bed    Modified Rankin (Stroke Patients Only)       Balance Overall balance assessment: Independent                                           Pertinent Vitals/Pain Pain Assessment Pain Assessment: 0-10 Pain Score: 6  Pain Location: RUQ Pain Descriptors / Indicators: Sore Pain Intervention(s): Limited activity within patient's tolerance, Monitored during session, Patient requesting pain meds-RN notified    Home Living Family/patient expects to be discharged to:: Unsure                   Additional Comments: moved here from [place] in June to help her daughter who has cancer, rents a second floor room so has flight of stairs to get to her room; reports she has no  assistance available locally (daughter is sick with cancer)    Prior Function Prior Level of Function : Independent/Modified Independent;Driving             Mobility Comments: walks without AD, denies falls in past 6 months ADLs Comments: independent, reports she could drive and get groceries, when pain got worse last week she had groceries delivered     Extremity/Trunk Assessment   Upper Extremity Assessment Upper Extremity Assessment: Overall WFL for tasks assessed    Lower Extremity Assessment Lower Extremity Assessment: Overall WFL for tasks assessed    Cervical / Trunk Assessment Cervical / Trunk Assessment: Normal  Communication    Communication Communication: No apparent difficulties  Cognition Arousal: Alert Behavior During Therapy: WFL for tasks assessed/performed Overall Cognitive Status: Within Functional Limits for tasks assessed                                          General Comments      Exercises     Assessment/Plan    PT Assessment Patient needs continued PT services  PT Problem List Decreased activity tolerance;Cardiopulmonary status limiting activity       PT Treatment Interventions Therapeutic activities;Therapeutic exercise;Balance training;Patient/family education    PT Goals (Current goals can be found in the Care Plan section)  Acute Rehab PT Goals Patient Stated Goal: to get stronger PT Goal Formulation: With patient Time For Goal Achievement: 05/12/23 Potential to Achieve Goals: Fair    Frequency Min 1X/week     Co-evaluation               AM-PAC PT "6 Clicks" Mobility  Outcome Measure Help needed turning from your back to your side while in a flat bed without using bedrails?: None Help needed moving from lying on your back to sitting on the side of a flat bed without using bedrails?: A Little Help needed moving to and from a bed to a chair (including a wheelchair)?: None Help needed standing up from a chair using your arms (e.g., wheelchair or bedside chair)?: None Help needed to walk in hospital room?: None Help needed climbing 3-5 steps with a railing? : A Little 6 Click Score: 22    End of Session   Activity Tolerance: Patient limited by pain Patient left: in chair;with call bell/phone within reach Nurse Communication: Mobility status PT Visit Diagnosis: Difficulty in walking, not elsewhere classified (R26.2)    Time: 1610-9604 PT Time Calculation (min) (ACUTE ONLY): 20 min   Charges:   PT Evaluation $PT Eval Low Complexity: 1 Low   PT General Charges $$ ACUTE PT VISIT: 1 Visit         Tamala Ser PT 04/28/2023   Acute Rehabilitation Services  Office [phone]

## 2023-04-28 NOTE — H&P (Signed)
History and Physical    Jeanne Mcclure WUJ:[id number] DOB: 1950/12/14 DOA: 04/27/2023  PCP: Pcp, No   Patient coming from: Home   Chief Complaint:  Chief Complaint  Patient presents with   Abdominal Pain   Flank Pain    HPI:  Jeanne Mcclure is a 72 y.o. female with hx of hypertension, diabetes, COPD, degenerative arthritis, bipolar disorder, recent ED visit 10/19 with suicidal ideation ultimately cleared by psychiatry, who presents due to persistent right upper quadrant pain.  Patient reports that she has been dealing with intermittent right upper quadrant pain over the past year or so.  States that over the past few weeks has become persistent and now mostly constant, sharp, 10 out of 10 in severity.  Denies any modification with p.o. intake.  She has associated nausea but no vomiting.  Feels constipated.  Otherwise decreased p.o. intake over the past week especially and overall with generalized weakness.  Noted some changes in urine, "looks yellow".  On further history also reports a significant weight loss from 214 pounds to 160 pound over the past year, she attributes to making better choices with diabetic diet.  Occasional night sweats.    Review of Systems:  ROS complete and negative except as marked above   Allergies  Allergen Reactions   Hydrocodone Itching   Peanut-Containing Drug Products Itching    Mouth and throats itches   Codeine Itching   Egg Solids, Whole    Fruit Extracts    Iodine    Penicillins Itching and Rash    Prior to Admission medications   Medication Sig Start Date End Date Taking? Authorizing Provider  albuterol (VENTOLIN HFA) 108 (90 Base) MCG/ACT inhaler Inhale 2 puffs into the lungs every 4 (four) hours as needed for shortness of breath. 07/18/22  Yes [provider]  Azelastine HCl 137 MCG/SPRAY SOLN Place 2 sprays into both nostrils 2 (two) times daily. 08/20/22  Yes [provider]  cyclobenzaprine (FLEXERIL) 5 MG tablet Take 5 mg by  mouth 3 (three) times daily as needed for muscle spasms.   Yes [provider]  diphenhydrAMINE (BENADRYL) 25 mg capsule Take 25 mg by mouth every 8 (eight) hours as needed for sleep.   Yes [provider]  furosemide (LASIX) 20 MG tablet Take 20 mg by mouth daily as needed for fluid. 04/16/23  Yes [provider]  gabapentin (NEURONTIN) 100 MG capsule Take 200 mg by mouth at bedtime. 03/04/23  Yes [provider]  lisinopril (ZESTRIL) 20 MG tablet Take 20 mg by mouth 2 (two) times daily as needed (hbp).   Yes [provider]  mometasone (ELOCON) 0.1 % cream Apply 1 Application topically daily.   Yes [provider]  montelukast (SINGULAIR) 10 MG tablet Take 10 mg by mouth at bedtime.   Yes [provider]  Potassium Bicarb-Citric Acid 20 MEQ TBEF Take 1 tablet by mouth daily.   Yes [provider]    Past Medical History:  Diagnosis Date   Asthma    COPD (chronic obstructive pulmonary disease) (HCC)    COVID-19 virus detected 08/2019   Hypertension     Past Surgical History:  Procedure Laterality Date   ROTATOR CUFF REPAIR  2010   TUBAL LIGATION  1982     reports that she has been smoking cigarettes. She started smoking about 9 months ago. She has a 0.4 pack-year smoking history. She has never used smokeless tobacco. She reports that she does not drink alcohol  and does not use drugs.  Family History  Problem Relation Age of Onset   Heart failure Mother    Heart failure Father      Physical Exam: Vitals:   04/27/23 2130 04/28/23 0052 04/28/23 0415 04/28/23 0545  BP:  (!) 162/83 (!) 140/67 (!) 143/82  Pulse: (!) 114 (!) 116 99 (!) 106  Resp: 14 18 16 14   Temp:  97.7 F (36.5 C)  98.6 F (37 C)  TempSrc:  Oral  Oral  SpO2: 94% 95% 92% 96%  Weight:      Height:        Gen: Awake, alert, chronically ill-appearing CV: Regular, normal S1, S2, no murmurs  Resp: Normal WOB, CTAB  Abd: Round, nondistended,  normoactive, mild right upper quadrant tenderness MSK: Symmetric, trace edema Skin: No rashes or lesions to exposed skin  Neuro: Alert and interactive, fully oriented Psych: Affect is sad   Data review:   Labs reviewed, notable for:   Lactate 4, down to 2.9 after 3 L IV fluid  T. bili 3.6, conjugated 1.9, AST 152, ALT 57, alk phos 278 WBC 8  K3.4  Micro:  No results found for this or any previous visit.  Imaging reviewed:  CT CHEST ABDOMEN PELVIS WO CONTRAST  Result Date: 04/27/2023 CLINICAL DATA:  Unintended weight loss, right upper quadrant pain and flank pain for several days. Dark colored urine. EXAM: CT CHEST, ABDOMEN AND PELVIS WITHOUT CONTRAST TECHNIQUE: Multidetector CT imaging of the chest, abdomen and pelvis was performed following the standard protocol without IV contrast. RADIATION DOSE REDUCTION: This exam was performed according to the departmental dose-optimization program which includes automated exposure control, adjustment of the mA and/or kV according to patient size and/or use of iterative reconstruction technique. COMPARISON:  Same day ultrasound FINDINGS: CT CHEST FINDINGS Cardiovascular: No pericardial effusion. Coronary artery and aortic atherosclerotic calcification. Normal caliber thoracic aorta. Mediastinum/Nodes: Trachea and esophagus are unremarkable. No thoracic adenopathy noting decreased sensitivity on noncontrast exam. Lungs/Pleura: Bibasilar atelectasis/scarring. Trace right pleural effusion. No pneumothorax. Musculoskeletal: No acute fracture or destructive osseous lesion. CT ABDOMEN PELVIS FINDINGS Hepatobiliary: The liver is enlarged and contains multiple poorly defined hypoattenuating lesions. These are incompletely evaluated without IV contrast. Small amount of subcapsular fluid about the anterior right hepatic lobe. Contracted gallbladder. No biliary dilation. Pancreas: Unremarkable. No pancreatic ductal dilatation or surrounding inflammatory changes.  Spleen: Unremarkable. Adrenals/Urinary Tract: Normal adrenal glands and kidneys. Unremarkable bladder. Stomach/Bowel: Normal caliber large and small bowel. Stranding and fluid about a diverticulum at the hepatic flexure (circa series 8/image 50.) normal appendix. Stomach is within normal limits. Vascular/Lymphatic: No evidence of lymphadenopathy on noncontrast exam. Aortic atherosclerotic calcification. Reproductive: Calcified uterine fibroids. Prominent ovaries are normal assessed without contrast. Other: Small volume perihepatic and pelvic free fluid. No free intraperitoneal air. Musculoskeletal: No acute fracture or destructive osseous lesion. Age related spondylosis. Grade 1 anterolisthesis of L4. No destructive osseous lesion. Degenerative changes at the pubic symphysis. IMPRESSION: 1. Hepatomegaly with multiple poorly defined hypoattenuating lesions. These are incompletely evaluated without IV contrast but are concerning for malignancy. Recommend further evaluation with liver protocol MRI. 2. Acute uncomplicated diverticulitis at the hepatic flexure. 3. Small volume perihepatic and pelvic free fluid. Aortic Atherosclerosis (ICD10-I70.0). Electronically Signed   By: Minerva Fester M.D.   On: 04/27/2023 22:11   US Abdomen Limited RUQ (LIVER/GB)  Result Date: 04/27/2023 CLINICAL DATA:  Right upper quadrant pain for 2 weeks EXAM: ULTRASOUND ABDOMEN LIMITED RIGHT UPPER QUADRANT COMPARISON:  None Available. FINDINGS:  Gallbladder: Gallbladder is contracted, with nonspecific gallbladder wall thickening measuring up to 5 mm. No evidence of cholelithiasis. Negative sonographic Murphy sign. Common bile duct: Diameter: 2 mm Liver: Evaluation of the liver parenchyma is limited due to respiratory motion. Liver is enlarged and heterogeneous, with multiple heterogeneous hyperechoic masslike areas identified. Largest in the right lobe measures 5.4 cm, and these masslike areas displaces the hepatic vasculature and do not  demonstrate any significant increased Doppler flow. Dedicated liver CT is recommended for further evaluation. Portal vein is patent on color Doppler imaging with normal direction of blood flow towards the liver. Other: None. IMPRESSION: 1. Gallbladder is contracted, limiting its evaluation. Gallbladder wall thickening measuring up to 5 mm is a nonspecific finding, with no other sonographic evidence of cholelithiasis or cholecystitis. 2. Diffuse heterogeneity throughout the liver parenchyma, with hyperechoic masslike areas as above. Sonographic appearance is nonspecific, and dedicated liver CT with without IV contrast is recommended. Electronically Signed   By: Sharlet Salina M.D.   On: 04/27/2023 17:51    ED Course:  Imaging obtained per above demonstrating liver lesions.  ED provider discussed with [facility] who rec for MRCP and will see in the morning. noted to be septic and treated with Zosyn, 1.5 L IV fluid, potassium, fentanyl   Assessment/Plan:  72 y.o. female with hx hypertension, diabetes, COPD, degenerative arthritis, bipolar disorder, recent ED visit 10/19 with suicidal ideation ultimately cleared by psychiatry, who presents due to persistent right upper quadrant pain.  Found to have multiple hypodense liver lesions, suspected metastases with unknown primary.  Septic likely from intrahepatic cholestasis.   Intrahepatic cholestasis, multiple hypodense liver lesions, suspected metastases Question of diverticulitis at the hepatic flexure; Sepsis, secondary to above, present on admission, improving Lactic acidosis, improving On arrival tachycardic, tachypneic, with lactic acidosis to 4.  Worsening mixed pattern liver injury. Right upper quadrant ultrasound and subsequent CT without contrast demonstrating hypointense liver lesions per above.  There is also report of diverticulitis at the hepatic flexure which would be atypical, question if this may instead represent malignancy or other process.  Recommended for further evaluation with MRCP which has been done and is pending - [facility] consulted by EDP, to see patient this morning - MRCP has been completed and results are pending - Continue Zosyn 337 5 mg IV every 8 hours - Follow-up blood cultures - Status post 3 L IV fluid with improvement in lactate.  Hold on additional IV fluids due to large volume fluid resuscitation thus far - N.p.o. in case she would need tissue sampling or other GI intervention. - I discussed with the patient indeterminate nature of the liver lesions but that in the worst case scenario this could represent a malignancy which is spread to the liver  Deconditioning - PT OT evaluation  Chronic medical problems: Hypertension: Hold home Lasix, lisinopril Diabetes: Diet controlled, no need for SSI at this point.  Continue to monitor sugars, continue gabapentin History of bipolar disorder: Not on mood stabilizer.  Did have recent episode of suicidal ideation and was ultimately cleared by psychiatry.    Body mass index is 27.46 kg/m.    DVT prophylaxis:  SCDs Code Status:  Full Code; note when discussing CODE STATUS she states initially to does not think that she would want resuscitation.  However while thinking this over she wants to remain full code for now  Diet:  Diet Orders (From admission, onward)     Start     Ordered   04/27/23  2331  Diet NPO time specified Except for: Sips with Meds  Diet effective now       Question:  Except for  Answer:  Clearance Coots with Meds   04/27/23 2332           Family Communication: No Consults: [facility] Admission status:   Inpatient, Telemetry bed  Severity of Illness: The appropriate patient status for this patient is INPATIENT. Inpatient status is judged to be reasonable and necessary in order to provide the required intensity of service to ensure the patient's safety. The patient's presenting symptoms, physical exam findings, and initial radiographic and laboratory  data in the context of their chronic comorbidities is felt to place them at high risk for further clinical deterioration. Furthermore, it is not anticipated that the patient will be medically stable for discharge from the hospital within 2 midnights of admission.   * I certify that at the point of admission it is my clinical judgment that the patient will require inpatient hospital care spanning beyond 2 midnights from the point of admission due to high intensity of service, high risk for further deterioration and high frequency of surveillance required.*   Dolly Rias, MD [facility]  How to contact the [facility] Attending or Consulting provider 7A - 7P or covering provider during after hours 7P -7A, for this patient.  Check the care team in [facility] and look for a) attending/consulting [facility] provider listed and b) the [facility] team listed Log into [email or web address] and use [facility]'s universal password to access. If you do not have the password, please contact the hospital operator. Locate the [facility] provider you are looking for under [facility] and page to a number that you can be directly reached. If you still have difficulty reaching the provider, please page the [facility] (Director on Call) for the Hospitalists listed on amion for assistance.  04/28/2023, 6:48 AM

## 2023-04-28 NOTE — Progress Notes (Signed)
I reviewed the patient's chart.  A consultation was requested, but after reviewing the chart there is currently no need for a GI consultation.  It appears that there liver enzyme elevations are as a result of the severe metastatic disease.  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  She was recommended a one year follow up and multiple attempts were made to have her follow up.  She never responded and never followed up.  It is a possibility that the metastatic disease is from a colon cancer.  A colonoscopy cannot be performed at this time as she had a hepatic flexure diverticulitis.  This can explain her RUQ pain.    It is recommended that she have IR sample the hepatic lesions.

## 2023-04-29 DIAGNOSIS — A419 Sepsis, unspecified organism: Secondary | ICD-10-CM | POA: Diagnosis not present

## 2023-04-29 LAB — GLUCOSE, CAPILLARY
Glucose-Capillary: 74 mg/dL (ref 70–99)
Glucose-Capillary: 69 mg/dL — ABNORMAL LOW (ref 70–99)
Glucose-Capillary: 82 mg/dL (ref 70–99)

## 2023-04-29 LAB — BASIC METABOLIC PANEL
Anion gap: 13 (ref 5–15)
BUN: 7 mg/dL — ABNORMAL LOW (ref 8–23)
CO2: 25 mmol/L (ref 22–32)
Calcium: 8.9 mg/dL (ref 8.9–10.3)
Chloride: 98 mmol/L (ref 98–111)
Creatinine, Ser: 0.47 mg/dL (ref 0.44–1.00)
GFR, Estimated: >60 mL/min (ref >60)
Glucose, Bld: 85 mg/dL (ref 70–99)
Potassium: 3.3 mmol/L — ABNORMAL LOW (ref 3.5–5.1)
Sodium: 136 mmol/L (ref 135–145)

## 2023-04-29 MED ORDER — KETOROLAC TROMETHAMINE 15 MG/ML IJ SOLN
15.0000 mg | Freq: Four times a day (QID) | INTRAMUSCULAR | Status: AC | PRN
  Administered 2023-04-29 – 2023-05-02 (×3): 15 mg via INTRAVENOUS
  Filled 2023-04-29 (×4): qty 1

## 2023-04-29 MED ORDER — POTASSIUM CHLORIDE CRYS ER 20 MEQ PO TBCR
40.0000 meq | EXTENDED_RELEASE_TABLET | Freq: Once | ORAL | Status: AC
  Administered 2023-04-29: 40 meq via ORAL
  Filled 2023-04-29: qty 2

## 2023-04-29 MED ORDER — LIDOCAINE HCL 1 % IJ SOLN
INTRAMUSCULAR | Status: AC
  Filled 2023-04-29: qty 20

## 2023-04-29 MED ORDER — DEXTROSE-SODIUM CHLORIDE 5-0.45 % IV SOLN: INTRAVENOUS | Status: AC

## 2023-04-29 MED ORDER — GELATIN ABSORBABLE 12-7 MM EX MISC
CUTANEOUS | Status: AC
  Filled 2023-04-29: qty 1

## 2023-04-29 NOTE — Progress Notes (Signed)
Patient refused blood sugar checks, vital signs and some of her medicine. Called patient's daughter and made aware. Provider informed.

## 2023-04-29 NOTE — Progress Notes (Signed)
Patient is refusing assessment & BG checks at this time. Several attempts made & explained in depth the importance of both-patient continued to decline. MD Kc notified successfully.

## 2023-04-29 NOTE — Progress Notes (Signed)
PROGRESS NOTE Jeanne Mcclure  WUJ:[id number] DOB: 02-04-1951 DOA: 04/27/2023 PCP: Oneita Hurt, No  Brief Narrative/Hospital Course: 72 year old female with hypertension diabetes COPD/asthma,DJD, bipolar disorder recent ED visits in 10/19 with suicidal ideation cleared by psychiatry presented from home with complaint of right upper quadrant abdominal pain flank pain for several days and having dark-colored urine, patient has been dealing with intermittent right upper quadrant abdominal pain over the past year and had been persistent since past few weeks.    Subjective: Patient seen examined this morning Appears a bit depressed today Overnight afebrile, had mild tachycardia, BP stable  Patient had been hypoglycemic low cbg at 69 Refusing CBG check  Assessment and Plan: Principal Problem:   Sepsis (HCC)   Severe hepatomegaly with infiltrative innumerable hypervascular lesions Concerning for metastatic disease widespread in the liver Intrahepatic cholestasis: Likely from intrahepatic cholestasis. MRCP finding reviewed with GI Dr. Elnoria Howard did not advise colonoscopy advised IR evaluation for liver biopsy and IR has been consulted. It seems patient did not follow-up on her follow-up colonoscopy with Dr. Elnoria Howard in the past. Concern about possible colon malignancy  Acute diverticulitis at the hepatic flexure Severe sepsis secondary to above POA: Presented with tachycardia tachypnea and lactic acidosis and met severe sepsis criteria .  Lactic acid is improved from 5-4 0.0-2.9, vitals stable at this time.  Monitor. Continue current IV antibiotics with Zosyn.  Clear liquid diet but n.p.o. for biopsy today, discussed with Dr. Elnoria Howard he does not feel patient can tolerate colonoscopy at this time given acute diverticulitis  Normocytic anemia: hemoglobin at 10.3 g, monitor. Recent Labs  Lab 04/27/23 1719 04/27/23 1739 04/28/23 0559  HGB 10.8* 11.6* 10.3*  HCT 32.3* 34.0* 30.8*   HTN: BP is soft  well-controlled, holding home meds-Lasix and lisinopril, for now continue low-dose metoprolol.    Diabetes mellitus diet controlled Hypoglycemia: Currently n.p.o. we will keep on IV fluid hydration with dextrose, monitor CBG -refusing CBG check today despite multiple request.  Will check BMP Recent Labs  Lab 04/28/23 1615 04/28/23 2045 04/28/23 2230 04/29/23 0342 04/29/23 0736  GLUCAP 134* 69* 80 74 69*    Bipolar disorder: Mood is stable.  Not on meds.  Recent episode of suicidal ideation and was cleared by psychiatry.  DVT prophylaxis: SCDs Start: 04/27/23 2324 Code Status:   Code Status: Full Code Family Communication: plan of care discussed with patient at bedside. Patient status is: Inpatient because of sepsis, liver lesion Level of care: Telemetry   Dispo: The patient is from: HOME            Anticipated disposition: TBD  Objective: Vitals last 24 hrs: Vitals:   04/28/23 2043 04/29/23 0345 04/29/23 0500 04/29/23 0820  BP: (!) 150/70 118/67  (!) 118/58  Pulse: (!) 107 96    Resp: 20 19    Temp: 98.2 F (36.8 C) 98.2 F (36.8 C)    TempSrc: Oral Oral    SpO2: 93% 94%    Weight:   80.9 kg   Height:       Weight change: 6.056 kg  Physical Examination: General exam: alert awake, oriented, bit depressed appearing.  HEENT:Oral mucosa moist, Ear/Nose WNL grossly Respiratory system: Bilaterally clear BS,no use of accessory muscle Cardiovascular system: S1 & S2 +, No JVD. Gastrointestinal system: Abdomen soft, RUQ is tender,ND, BS+ Nervous System: Alert, awake, moving all extremities,and following commands. Extremities: LE edema neg,distal peripheral pulses palpable and warm.  Skin: No rashes,no icterus. MSK: Normal muscle bulk,tone, power   Medications reviewed:  Scheduled Meds:  feeding supplement  1 Container Oral TID BM   gabapentin  200 mg Oral QHS   insulin aspart  0-6 Units Subcutaneous TID WC   metoprolol tartrate  12.5 mg Oral BID   montelukast  10 mg  Oral QHS   sodium chloride flush  3 mL Intravenous Q12H   Continuous Infusions:  dextrose 5 % and 0.45 % NaCl 75 mL/hr at 04/29/23 0837   piperacillin-tazobactam (ZOSYN)  IV 3.375 g (04/29/23 4034)    Diet Order             Diet NPO time specified  Diet effective midnight                  Intake/Output Summary (Last 24 hours) at 04/29/2023 0951 Last data filed at 04/29/2023 7425 Gross per 24 hour  Intake 1269.1 ml  Output --  Net 1269.1 ml   Net IO Since Admission: 1,269.1 mL [04/29/23 0951]  Wt Readings from Last 3 Encounters:  04/29/23 80.9 kg  04/19/23 72.6 kg     Unresulted Labs (From admission, onward)    None     Data Reviewed: I have personally reviewed following labs and imaging studies CBC: Recent Labs  Lab 04/27/23 1719 04/27/23 1739 04/28/23 0559  WBC 8.3  --  8.5  NEUTROABS 6.5  --   --   HGB 10.8* 11.6* 10.3*  HCT 32.3* 34.0* 30.8*  MCV 95.3  --  96.9  PLT 452*  --  439*   Basic Metabolic Panel: Recent Labs  Lab 04/27/23 1719 04/27/23 1739 04/28/23 0559  NA 136 137 136  K 3.3* 3.4* 4.0  CL 98 99 100  CO2 23  --  23  GLUCOSE 83 81 65*  BUN 10 7* 8  CREATININE 0.45 0.40* 0.55  CALCIUM 9.3  --  9.1  MG 1.9  --  1.7  PHOS  --   --  3.4   GFR: Estimated Creatinine Clearance: 67.8 mL/min (by C-G formula based on SCr of 0.55 mg/dL). Liver Function Tests: Recent Labs  Lab 04/27/23 1719 04/28/23 0559  AST 152* 153*  ALT 57* 53*  ALKPHOS 278* 237*  BILITOT 3.6* 4.1*  PROT 6.6 6.2*  ALBUMIN 3.0* 2.8*   Recent Labs  Lab 04/27/23 1719  LIPASE 21   No results for input(s): "AMMONIA" in the last 168 hours. Coagulation Profile: Recent Labs  Lab 04/28/23 0559  INR 1.2  Sepsis Labs: Recent Labs  Lab 04/27/23 1852 04/27/23 2036 04/28/23 0039 04/28/23 0559  LATICACIDVEN 3.2* 3.4* 3.1* 2.9*    No results found for this or any previous visit (from the past 240 hour(s)).  Antimicrobials: Anti-infectives (From admission,  onward)    Start     Dose/Rate Route Frequency Ordered Stop   04/28/23 0600  piperacillin-tazobactam (ZOSYN) IVPB 3.375 g        3.375 g 12.5 mL/hr over 240 Minutes Intravenous Every 8 hours 04/27/23 2332     04/27/23 2245  piperacillin-tazobactam (ZOSYN) IVPB 3.375 g        3.375 g 100 mL/hr over 30 Minutes Intravenous  Once 04/27/23 2230 04/28/23 0019      Culture/Microbiology No results found for: "SDES", "SPECREQUEST", "CULT", "REPTSTATUS"   Radiology Studies: MR ABDOMEN MRCP W WO CONTAST  Result Date: 04/28/2023 CLINICAL DATA:  72 year old female with history of right upper quadrant abdominal pain. Possible metastatic disease to the liver noted on prior CT examination. Follow-up study. EXAM: MRI ABDOMEN WITHOUT AND  WITH CONTRAST (INCLUDING MRCP) TECHNIQUE: Multiplanar multisequence MR imaging of the abdomen was performed both before and after the administration of intravenous contrast. Heavily T2-weighted images of the biliary and pancreatic ducts were obtained, and three-dimensional MRCP images were rendered by post processing. CONTRAST:  7mL GADAVIST GADOBUTROL 1 MMOL/ML IV SOLN COMPARISON:  No prior abdominal MRI. CT of the chest, abdomen and pelvis 04/27/2023. FINDINGS: Lower chest: Small right pleural effusion lying dependently. Hepatobiliary: Scattered throughout the liver there are innumerable predominantly T1 hypointense, heterogeneous T2 signal intensity (centrally T2 hypointense with variable degrees of internal and peripheral T2 hyperintensity), and generally hypovascular lesions scattered throughout the hepatic parenchyma, all of which restrict diffusion, highly concerning for diffuse metastatic disease to the liver. Among the largest lesions is a dominant lesion in the superior aspect of the left lobe of the liver centered predominantly in segment 4A (axial image 28 of series 19) measuring 6.1 x 5.0 cm. The liver is diffusely enlarged measuring 25.5 cm in craniocaudal span and  has a very nodular contour, likely reflective of metastatic disease (although underlying cirrhosis is not excluded). No intra or extrahepatic biliary ductal dilatation is noted. Gallbladder is unremarkable in appearance. Pancreas: No pancreatic mass. No pancreatic ductal dilatation. No pancreatic or peripancreatic fluid collections or inflammatory changes. Spleen:  Unremarkable. Adrenals/Urinary Tract: Bilateral kidneys and adrenal glands are normal in appearance. No hydroureteronephrosis in the visualized portions of the abdomen. Stomach/Bowel: Visualized portions are unremarkable. Vascular/Lymphatic: No aneurysm identified in the visualized abdominal vasculature. No lymphadenopathy noted in the abdomen. Other:  Trace volume of perihepatic ascites. Musculoskeletal: No aggressive appearing osseous lesions are noted in the visualized portions of the skeleton. IMPRESSION: 1. Severe hepatomegaly with infiltrative pattern of innumerable hypovascular lesions throughout the liver, presumably reflective of widespread metastatic disease. No definite primary source confidently identified on today's examination. 2. Small right pleural effusion lying dependently. Electronically Signed   By: Trudie Reed M.D.   On: 04/28/2023 15:31   CT CHEST ABDOMEN PELVIS WO CONTRAST  Result Date: 04/27/2023 CLINICAL DATA:  Unintended weight loss, right upper quadrant pain and flank pain for several days. Dark colored urine. EXAM: CT CHEST, ABDOMEN AND PELVIS WITHOUT CONTRAST TECHNIQUE: Multidetector CT imaging of the chest, abdomen and pelvis was performed following the standard protocol without IV contrast. RADIATION DOSE REDUCTION: This exam was performed according to the departmental dose-optimization program which includes automated exposure control, adjustment of the mA and/or kV according to patient size and/or use of iterative reconstruction technique. COMPARISON:  Same day ultrasound FINDINGS: CT CHEST FINDINGS  Cardiovascular: No pericardial effusion. Coronary artery and aortic atherosclerotic calcification. Normal caliber thoracic aorta. Mediastinum/Nodes: Trachea and esophagus are unremarkable. No thoracic adenopathy noting decreased sensitivity on noncontrast exam. Lungs/Pleura: Bibasilar atelectasis/scarring. Trace right pleural effusion. No pneumothorax. Musculoskeletal: No acute fracture or destructive osseous lesion. CT ABDOMEN PELVIS FINDINGS Hepatobiliary: The liver is enlarged and contains multiple poorly defined hypoattenuating lesions. These are incompletely evaluated without IV contrast. Small amount of subcapsular fluid about the anterior right hepatic lobe. Contracted gallbladder. No biliary dilation. Pancreas: Unremarkable. No pancreatic ductal dilatation or surrounding inflammatory changes. Spleen: Unremarkable. Adrenals/Urinary Tract: Normal adrenal glands and kidneys. Unremarkable bladder. Stomach/Bowel: Normal caliber large and small bowel. Stranding and fluid about a diverticulum at the hepatic flexure (circa series 8/image 50.) normal appendix. Stomach is within normal limits. Vascular/Lymphatic: No evidence of lymphadenopathy on noncontrast exam. Aortic atherosclerotic calcification. Reproductive: Calcified uterine fibroids. Prominent ovaries are normal assessed without contrast. Other: Small volume perihepatic and pelvic free  fluid. No free intraperitoneal air. Musculoskeletal: No acute fracture or destructive osseous lesion. Age related spondylosis. Grade 1 anterolisthesis of L4. No destructive osseous lesion. Degenerative changes at the pubic symphysis. IMPRESSION: 1. Hepatomegaly with multiple poorly defined hypoattenuating lesions. These are incompletely evaluated without IV contrast but are concerning for malignancy. Recommend further evaluation with liver protocol MRI. 2. Acute uncomplicated diverticulitis at the hepatic flexure. 3. Small volume perihepatic and pelvic free fluid. Aortic  Atherosclerosis (ICD10-I70.0). Electronically Signed   By: Minerva Fester M.D.   On: 04/27/2023 22:11   US Abdomen Limited RUQ (LIVER/GB)  Result Date: 04/27/2023 CLINICAL DATA:  Right upper quadrant pain for 2 weeks EXAM: ULTRASOUND ABDOMEN LIMITED RIGHT UPPER QUADRANT COMPARISON:  None Available. FINDINGS: Gallbladder: Gallbladder is contracted, with nonspecific gallbladder wall thickening measuring up to 5 mm. No evidence of cholelithiasis. Negative sonographic Murphy sign. Common bile duct: Diameter: 2 mm Liver: Evaluation of the liver parenchyma is limited due to respiratory motion. Liver is enlarged and heterogeneous, with multiple heterogeneous hyperechoic masslike areas identified. Largest in the right lobe measures 5.4 cm, and these masslike areas displaces the hepatic vasculature and do not demonstrate any significant increased Doppler flow. Dedicated liver CT is recommended for further evaluation. Portal vein is patent on color Doppler imaging with normal direction of blood flow towards the liver. Other: None. IMPRESSION: 1. Gallbladder is contracted, limiting its evaluation. Gallbladder wall thickening measuring up to 5 mm is a nonspecific finding, with no other sonographic evidence of cholelithiasis or cholecystitis. 2. Diffuse heterogeneity throughout the liver parenchyma, with hyperechoic masslike areas as above. Sonographic appearance is nonspecific, and dedicated liver CT with without IV contrast is recommended. Electronically Signed   By: Sharlet Salina M.D.   On: 04/27/2023 17:51     LOS: 2 days   Lanae Boast, MD [facility]  04/29/2023, 9:51 AM

## 2023-04-29 NOTE — Consult Note (Signed)
Chief Complaint: Patient was seen in consultation today for liver lesion biopsy   Referring Physician(s): Dr. Lanae Boast  Supervising Physician: Marliss Coots  Patient Status: [facility] - In-pt  History of Present Illness: Jeanne Mcclure is a 72 y.o. female admitted with abd pain. Imaging finds some mild diverticulitis at the hepatic flexure but also hepatomegaly with numerous liver lesions concerning for metastatic process.  IR is consulted for biopsy.  PMHx, meds, labs, imaging, allergies reviewed. Feels okay. A bit overwhelmed with what has been found. Has been NPO today as directed.    Past Medical History:  Diagnosis Date   Asthma    COPD (chronic obstructive pulmonary disease) (HCC)    COVID-19 virus detected 08/2019   Hypertension     Past Surgical History:  Procedure Laterality Date   ROTATOR CUFF REPAIR  2010   TUBAL LIGATION  1982    Allergies: Hydrocodone; Peanut-containing drug products; Codeine; Egg solids, whole; Fruit extracts; Iodine; and Penicillins  Medications:  Current Facility-Administered Medications:    acetaminophen (TYLENOL) tablet 1,000 mg, 1,000 mg, Oral, Q6H PRN, Dolly Rias, MD   albuterol (PROVENTIL) (2.5 MG/3ML) 0.083% nebulizer solution 2.5 mg, 2.5 mg, Nebulization, Q4H PRN, Segars, Christiane Ha, MD   dextrose 5 % and 0.45 % NaCl infusion, , Intravenous, Continuous, Kc, Ramesh, MD, Last Rate: 75 mL/hr at 04/29/23 0837, Rate Change at 04/29/23 0837   feeding supplement (BOOST / RESOURCE BREEZE) liquid 1 Container, 1 Container, Oral, TID BM, Kc, Dayna Barker, MD, 1 Container at 04/28/23 1416   gabapentin (NEURONTIN) capsule 200 mg, 200 mg, Oral, QHS, Segars, Christiane Ha, MD, 200 mg at 04/28/23 2229   insulin aspart (novoLOG) injection 0-6 Units, 0-6 Units, Subcutaneous, TID WC, Kc, Ramesh, MD   metoprolol tartrate (LOPRESSOR) tablet 12.5 mg, 12.5 mg, Oral, BID, Kc, Ramesh, MD, 12.5 mg at 04/28/23 2229   montelukast (SINGULAIR) tablet 10 mg, 10 mg,  Oral, QHS, Segars, Christiane Ha, MD, 10 mg at 04/28/23 2229   ondansetron (ZOFRAN) injection 4 mg, 4 mg, Intravenous, Q6H PRN, Gloris Manchester, MD, 4 mg at 04/27/23 1719   oxyCODONE (Oxy IR/ROXICODONE) immediate release tablet 2.5 mg, 2.5 mg, Oral, Q6H PRN, 2.5 mg at 04/28/23 1821 **OR** oxyCODONE (Oxy IR/ROXICODONE) immediate release tablet 5 mg, 5 mg, Oral, Q6H PRN, Dolly Rias, MD, 5 mg at 04/29/23 0148   piperacillin-tazobactam (ZOSYN) IVPB 3.375 g, 3.375 g, Intravenous, Q8H, Segars, Christiane Ha, MD, Last Rate: 12.5 mL/hr at 04/29/23 0620, 3.375 g at 04/29/23 2841   sodium chloride flush (NS) 0.9 % injection 3 mL, 3 mL, Intravenous, Q12H, Segars, Christiane Ha, MD, 3 mL at 04/28/23 2230    Family History  Problem Relation Age of Onset   Heart failure Mother    Heart failure Father     Social History   Socioeconomic History   Marital status: Divorced    Spouse name: Not on file   Number of children: Not on file   Years of education: Not on file   Highest education level: Not on file  Occupational History   Not on file  Tobacco Use   Smoking status: Every Day    Current packs/day: 0.50    Average packs/day: 0.5 packs/day for 0.8 years (0.4 ttl pk-yrs)    Types: Cigarettes    Start date: 2024    Last attempt to quit: 09/04/2013   Smokeless tobacco: Never  Vaping Use   Vaping status: Never Used  Substance and Sexual Activity   Alcohol use: No   Drug use: No  Sexual activity: Not on file  Other Topics Concern   Not on file  Social History Narrative   Not on file   Social Determinants of Health   Financial Resource Strain: Not on file  Food Insecurity: No Food Insecurity (04/28/2023)   Hunger Vital Sign    Worried About Running Out of Food in the Last Year: Never true    Ran Out of Food in the Last Year: Never true  Transportation Needs: No Transportation Needs (04/28/2023)   PRAPARE - Administrator, Civil Service (Medical): No    Lack of Transportation  (Non-Medical): No  Physical Activity: Not on file  Stress: Not on file  Social Connections: Not on file    Review of Systems: A 12 point ROS discussed and pertinent positives are indicated in the HPI above.  All other systems are negative.  Review of Systems  Vital Signs: BP (!) 118/58 (BP Location: Right Arm)   Pulse 96   Temp 98.2 F (36.8 C) (Oral)   Resp 19   Ht 5\' 5"  (1.651 m)   Wt 178 lb 5.6 oz (80.9 kg)   SpO2 94%   BMI 29.68 kg/m   Physical Exam Constitutional:      Appearance: She is well-developed.  HENT:     Mouth/Throat:     Mouth: Mucous membranes are moist.     Pharynx: Oropharynx is clear.  Cardiovascular:     Rate and Rhythm: Normal rate and regular rhythm.     Heart sounds: Normal heart sounds.  Pulmonary:     Effort: Pulmonary effort is normal. No respiratory distress.     Breath sounds: Normal breath sounds.  Abdominal:     General: Abdomen is flat. There is no distension.     Palpations: Abdomen is soft. There is no mass.  Skin:    General: Skin is warm and dry.  Neurological:     General: No focal deficit present.     Mental Status: She is alert and oriented to person, place, and time.  Psychiatric:        Mood and Affect: Mood normal.        Thought Content: Thought content normal.     Imaging: MR ABDOMEN MRCP W WO CONTAST  Result Date: 04/28/2023 CLINICAL DATA:  72 year old female with history of right upper quadrant abdominal pain. Possible metastatic disease to the liver noted on prior CT examination. Follow-up study. EXAM: MRI ABDOMEN WITHOUT AND WITH CONTRAST (INCLUDING MRCP) TECHNIQUE: Multiplanar multisequence MR imaging of the abdomen was performed both before and after the administration of intravenous contrast. Heavily T2-weighted images of the biliary and pancreatic ducts were obtained, and three-dimensional MRCP images were rendered by post processing. CONTRAST:  7mL GADAVIST GADOBUTROL 1 MMOL/ML IV SOLN COMPARISON:  No prior  abdominal MRI. CT of the chest, abdomen and pelvis 04/27/2023. FINDINGS: Lower chest: Small right pleural effusion lying dependently. Hepatobiliary: Scattered throughout the liver there are innumerable predominantly T1 hypointense, heterogeneous T2 signal intensity (centrally T2 hypointense with variable degrees of internal and peripheral T2 hyperintensity), and generally hypovascular lesions scattered throughout the hepatic parenchyma, all of which restrict diffusion, highly concerning for diffuse metastatic disease to the liver. Among the largest lesions is a dominant lesion in the superior aspect of the left lobe of the liver centered predominantly in segment 4A (axial image 28 of series 19) measuring 6.1 x 5.0 cm. The liver is diffusely enlarged measuring 25.5 cm in craniocaudal span and has a very  nodular contour, likely reflective of metastatic disease (although underlying cirrhosis is not excluded). No intra or extrahepatic biliary ductal dilatation is noted. Gallbladder is unremarkable in appearance. Pancreas: No pancreatic mass. No pancreatic ductal dilatation. No pancreatic or peripancreatic fluid collections or inflammatory changes. Spleen:  Unremarkable. Adrenals/Urinary Tract: Bilateral kidneys and adrenal glands are normal in appearance. No hydroureteronephrosis in the visualized portions of the abdomen. Stomach/Bowel: Visualized portions are unremarkable. Vascular/Lymphatic: No aneurysm identified in the visualized abdominal vasculature. No lymphadenopathy noted in the abdomen. Other:  Trace volume of perihepatic ascites. Musculoskeletal: No aggressive appearing osseous lesions are noted in the visualized portions of the skeleton. IMPRESSION: 1. Severe hepatomegaly with infiltrative pattern of innumerable hypovascular lesions throughout the liver, presumably reflective of widespread metastatic disease. No definite primary source confidently identified on today's examination. 2. Small right pleural  effusion lying dependently. Electronically Signed   By: Trudie Reed M.D.   On: 04/28/2023 15:31   CT CHEST ABDOMEN PELVIS WO CONTRAST  Result Date: 04/27/2023 CLINICAL DATA:  Unintended weight loss, right upper quadrant pain and flank pain for several days. Dark colored urine. EXAM: CT CHEST, ABDOMEN AND PELVIS WITHOUT CONTRAST TECHNIQUE: Multidetector CT imaging of the chest, abdomen and pelvis was performed following the standard protocol without IV contrast. RADIATION DOSE REDUCTION: This exam was performed according to the departmental dose-optimization program which includes automated exposure control, adjustment of the mA and/or kV according to patient size and/or use of iterative reconstruction technique. COMPARISON:  Same day ultrasound FINDINGS: CT CHEST FINDINGS Cardiovascular: No pericardial effusion. Coronary artery and aortic atherosclerotic calcification. Normal caliber thoracic aorta. Mediastinum/Nodes: Trachea and esophagus are unremarkable. No thoracic adenopathy noting decreased sensitivity on noncontrast exam. Lungs/Pleura: Bibasilar atelectasis/scarring. Trace right pleural effusion. No pneumothorax. Musculoskeletal: No acute fracture or destructive osseous lesion. CT ABDOMEN PELVIS FINDINGS Hepatobiliary: The liver is enlarged and contains multiple poorly defined hypoattenuating lesions. These are incompletely evaluated without IV contrast. Small amount of subcapsular fluid about the anterior right hepatic lobe. Contracted gallbladder. No biliary dilation. Pancreas: Unremarkable. No pancreatic ductal dilatation or surrounding inflammatory changes. Spleen: Unremarkable. Adrenals/Urinary Tract: Normal adrenal glands and kidneys. Unremarkable bladder. Stomach/Bowel: Normal caliber large and small bowel. Stranding and fluid about a diverticulum at the hepatic flexure (circa series 8/image 50.) normal appendix. Stomach is within normal limits. Vascular/Lymphatic: No evidence of  lymphadenopathy on noncontrast exam. Aortic atherosclerotic calcification. Reproductive: Calcified uterine fibroids. Prominent ovaries are normal assessed without contrast. Other: Small volume perihepatic and pelvic free fluid. No free intraperitoneal air. Musculoskeletal: No acute fracture or destructive osseous lesion. Age related spondylosis. Grade 1 anterolisthesis of L4. No destructive osseous lesion. Degenerative changes at the pubic symphysis. IMPRESSION: 1. Hepatomegaly with multiple poorly defined hypoattenuating lesions. These are incompletely evaluated without IV contrast but are concerning for malignancy. Recommend further evaluation with liver protocol MRI. 2. Acute uncomplicated diverticulitis at the hepatic flexure. 3. Small volume perihepatic and pelvic free fluid. Aortic Atherosclerosis (ICD10-I70.0). Electronically Signed   By: Minerva Fester M.D.   On: 04/27/2023 22:11   US Abdomen Limited RUQ (LIVER/GB)  Result Date: 04/27/2023 CLINICAL DATA:  Right upper quadrant pain for 2 weeks EXAM: ULTRASOUND ABDOMEN LIMITED RIGHT UPPER QUADRANT COMPARISON:  None Available. FINDINGS: Gallbladder: Gallbladder is contracted, with nonspecific gallbladder wall thickening measuring up to 5 mm. No evidence of cholelithiasis. Negative sonographic Murphy sign. Common bile duct: Diameter: 2 mm Liver: Evaluation of the liver parenchyma is limited due to respiratory motion. Liver is enlarged and heterogeneous, with multiple heterogeneous hyperechoic masslike  areas identified. Largest in the right lobe measures 5.4 cm, and these masslike areas displaces the hepatic vasculature and do not demonstrate any significant increased Doppler flow. Dedicated liver CT is recommended for further evaluation. Portal vein is patent on color Doppler imaging with normal direction of blood flow towards the liver. Other: None. IMPRESSION: 1. Gallbladder is contracted, limiting its evaluation. Gallbladder wall thickening measuring up  to 5 mm is a nonspecific finding, with no other sonographic evidence of cholelithiasis or cholecystitis. 2. Diffuse heterogeneity throughout the liver parenchyma, with hyperechoic masslike areas as above. Sonographic appearance is nonspecific, and dedicated liver CT with without IV contrast is recommended. Electronically Signed   By: Sharlet Salina M.D.   On: 04/27/2023 17:51    Labs:  CBC: Recent Labs    04/19/23 1151 04/27/23 1719 04/27/23 1739 04/28/23 0559  WBC 8.3 8.3  --  8.5  HGB 11.5* 10.8* 11.6* 10.3*  HCT 34.9* 32.3* 34.0* 30.8*  PLT 414* 452*  --  439*    COAGS: Recent Labs    04/28/23 0559  INR 1.2    BMP: Recent Labs    04/19/23 1151 04/27/23 1719 04/27/23 1739 04/28/23 0559  NA 138 136 137 136  K 3.6 3.3* 3.4* 4.0  CL 100 98 99 100  CO2 22 23  --  23  GLUCOSE 92 83 81 65*  BUN 9 10 7* 8  CALCIUM 9.4 9.3  --  9.1  CREATININE 0.68 0.45 0.40* 0.55  GFRNONAA >60 >60  --  >60    LIVER FUNCTION TESTS: Recent Labs    04/19/23 1151 04/27/23 1719 04/28/23 0559  BILITOT 1.4* 3.6* 4.1*  AST 132* 152* 153*  ALT 56* 57* 53*  ALKPHOS 300* 278* 237*  PROT 6.7 6.6 6.2*  ALBUMIN 3.1* 3.0* 2.8*     Assessment and Plan: Abd pain Hepatic lesions concerning for metastatic process. Plan for US guided biopsy Labs reviewed. Risks and benefits of liver lesion biopsy was discussed with the patient and/or patient's family including, but not limited to bleeding, infection, damage to adjacent structures or low yield requiring additional tests.  All of the questions were answered and there is agreement to proceed.  Consent signed and in chart.    Electronically Signed: Brayton El, PA-C 04/29/2023, 9:57 AM   I spent a total of 30 minutes in face to face in clinical consultation, greater than 50% of which was counseling/coordinating care for liver biopsy

## 2023-04-29 NOTE — Progress Notes (Signed)
OT Cancellation Note  Patient Details Name: Jeanne Mcclure MRN: [id number] DOB: [birth year]   Cancelled Treatment:    Reason Eval/Treat Not Completed: OT screened, no needs identified, will sign off. OT spoke with the pt who adamantly declines the need for OT services. She stated she is able to manage her ADLs/self care tasks & does not have any concerns in this regard. She politely asked for OT to sign off.   Reuben Likes, OTR/L 04/29/2023, 1:23 PM

## 2023-04-30 ENCOUNTER — Encounter (HOSPITAL_COMMUNITY): Payer: Self-pay | Admitting: Internal Medicine

## 2023-04-30 ENCOUNTER — Inpatient Hospital Stay (HOSPITAL_COMMUNITY): Payer: Medicare HMO

## 2023-04-30 DIAGNOSIS — A419 Sepsis, unspecified organism: Secondary | ICD-10-CM | POA: Diagnosis not present

## 2023-04-30 LAB — BASIC METABOLIC PANEL
Anion gap: 11 (ref 5–15)
BUN: 6 mg/dL — ABNORMAL LOW (ref 8–23)
CO2: 23 mmol/L (ref 22–32)
Calcium: 8.3 mg/dL — ABNORMAL LOW (ref 8.9–10.3)
Chloride: 102 mmol/L (ref 98–111)
Creatinine, Ser: 0.5 mg/dL (ref 0.44–1.00)
GFR, Estimated: >60 mL/min (ref >60)
Glucose, Bld: 77 mg/dL (ref 70–99)
Potassium: 3.6 mmol/L (ref 3.5–5.1)
Sodium: 136 mmol/L (ref 135–145)

## 2023-04-30 LAB — CBC
HCT: 28 % — ABNORMAL LOW (ref 36.0–46.0)
Hemoglobin: 9.1 g/dL — ABNORMAL LOW (ref 12.0–15.0)
MCH: 32 pg (ref 26.0–34.0)
MCHC: 32.5 g/dL (ref 30.0–36.0)
MCV: 98.6 fL (ref 80.0–100.0)
Platelets: 426 10*3/uL — ABNORMAL HIGH (ref 150–400)
RBC: 2.84 MIL/uL — ABNORMAL LOW (ref 3.87–5.11)
RDW: 18.3 % — ABNORMAL HIGH (ref 11.5–15.5)
WBC: 6.9 10*3/uL (ref 4.0–10.5)
nRBC: 0.3 % — ABNORMAL HIGH (ref 0.0–0.2)

## 2023-04-30 MED ORDER — FENTANYL CITRATE (PF) 100 MCG/2ML IJ SOLN
INTRAMUSCULAR | Status: AC | PRN
  Administered 2023-04-30: 50 ug via INTRAVENOUS

## 2023-04-30 MED ORDER — LIDOCAINE HCL 1 % IJ SOLN
INTRAMUSCULAR | Status: AC
  Filled 2023-04-30: qty 20

## 2023-04-30 MED ORDER — MIDAZOLAM HCL 2 MG/2ML IJ SOLN
INTRAMUSCULAR | Status: AC
  Filled 2023-04-30: qty 2

## 2023-04-30 MED ORDER — MIDAZOLAM HCL 2 MG/2ML IJ SOLN
INTRAMUSCULAR | Status: AC | PRN
Start: 2023-04-30 — End: 2023-04-30
  Administered 2023-04-30: 1 mg via INTRAVENOUS

## 2023-04-30 MED ORDER — DEXTROSE-SODIUM CHLORIDE 5-0.45 % IV SOLN: INTRAVENOUS | Status: AC

## 2023-04-30 MED ORDER — FENTANYL CITRATE (PF) 100 MCG/2ML IJ SOLN
INTRAMUSCULAR | Status: AC
  Filled 2023-04-30: qty 2

## 2023-04-30 MED ORDER — GELATIN ABSORBABLE 12-7 MM EX MISC
CUTANEOUS | Status: AC
  Filled 2023-04-30: qty 1

## 2023-04-30 NOTE — Progress Notes (Signed)
PT Cancellation Note  Patient Details Name: Jeanne Mcclure MRN: [id number] DOB: September 12, 1950   Cancelled Treatment:      Pt with procedure today and declined therapy for today. Will see tomorrow.   Marella Bile 04/30/2023, 3:36 PM

## 2023-04-30 NOTE — Progress Notes (Signed)
Patient refused blood works. Provider made aware.

## 2023-04-30 NOTE — Procedures (Signed)
  Procedure:  [place] core liver lesion 18g x2 Preprocedure diagnosis: The primary encounter diagnosis was Hyperbilirubinemia. Diagnoses of Diverticulitis and Hypokalemia were also pertinent to this visit. Postprocedure diagnosis: same EBL:    minimal Complications:   none immediate  See full dictation in [employer].  Thora Lance MD Main # [phone] Pager  [phone] Mobile [phone]

## 2023-04-30 NOTE — Progress Notes (Signed)
PROGRESS NOTE Jeanne Mcclure  AVW:[id number] DOB: Feb 21, 1951 DOA: 04/27/2023 PCP: Oneita Hurt, No  Brief Narrative/Hospital Course: 72 year old female with hypertension diabetes COPD/asthma,DJD, bipolar disorder recent ED visits in 10/19 with suicidal ideation cleared by psychiatry presented from home with complaint of right upper quadrant abdominal pain flank pain for several days and having dark-colored urine, patient has been dealing with intermittent right upper quadrant abdominal pain over the past year and had been persistent since past few weeks.    Subjective: Patient seen examined this morning was upset wanting to leave the hospital requesting for transfer morning refused biopsy but after my discussion with patient and her daughter she agreed for biopsy Overnight afebrile, BP stable   assessment and Plan: Principal Problem:   Sepsis (HCC)   Severe hepatomegaly with infiltrative innumerable hypervascular lesions Concerning for metastatic disease widespread in the liver Intrahepatic cholestasis: MRCP done- reviewed w/ with GI Dr. Elnoria Howard did not advise colonoscopy advised IR evaluation for liver biopsy and IR has been consulted.It seems patient did not follow-up on her follow-up colonoscopy with Dr. Elnoria Howard in the past. Concern about possible colon malignancy This morning refused biopsy but subsequently agreed after further counseling and discussion with herself and her daughter  Acute diverticulitis at the hepatic flexure Severe sepsis secondary to above POA: Presented with tachycardia tachypnea and lactic acidosis and met severe sepsis criteria.  Clinical improvement, continue IV antibiotics with Zosyn.  Diet ADAT psot biopsy. Discussed with Dr. Elnoria Howard he does not feel patient can tolerate colonoscopy at this time given acute diverticulitis  Normocytic anemia: hemoglobin at 10.3 g, monitor. Recent Labs  Lab 04/27/23 1719 04/27/23 1739 04/28/23 0559  HGB 10.8* 11.6* 10.3*  HCT 32.3* 34.0* 30.8*    HTN: BP is soft  holding home meds-Lasix and lisinopril, for now continue low-dose metoprolol.    Diabetes mellitus diet controlled Hypoglycemia: Resolved.  Keep on dextrose containing fluids while n.p.o. for procedure.   Recent Labs  Lab 04/28/23 2045 04/28/23 2230 04/29/23 0342 04/29/23 0736 04/29/23 1640  GLUCAP 69* 80 74 69* 82    Bipolar disorder: Mood is stable.  Not on meds.  Recent episode of suicidal ideation and was cleared by psychiatry.  DVT prophylaxis: SCDs Start: 04/27/23 2324 Code Status:   Code Status: Full Code Family Communication: plan of care discussed with patient at bedside. Patient status is: Inpatient because of sepsis, liver lesion Level of care: Telemetry   Dispo: The patient is from: HOME            Anticipated disposition: TBD  Objective: Vitals last 24 hrs: Vitals:   04/29/23 0500 04/29/23 0820 04/29/23 1421 04/30/23 0500  BP:  (!) 118/58 (!) 147/74   Pulse:   (!) 103   Resp:   16   Temp:   98.9 F (37.2 C)   TempSrc:   Oral   SpO2:   95%   Weight: 80.9 kg   80.4 kg  Height:       Weight change: -0.5 kg  Physical Examination: General exam: alert awake, oriented x3 HEENT:Oral mucosa moist, Ear/Nose WNL grossly Respiratory system: Bilaterally clear BS,no use of accessory muscle Cardiovascular system: S1 & S2 +, No JVD. Gastrointestinal system: Abdomen soft, mildly tender in RUQ, BS+ Nervous System: Alert, awake, moving all extremities,and following commands. Extremities: LE edema neg,distal peripheral pulses palpable and warm.  Skin: No rashes,no icterus. MSK: Normal muscle bulk,tone, power   Medications reviewed: Scheduled Meds:  feeding supplement  1 Container Oral TID BM  gabapentin  200 mg Oral QHS   insulin aspart  0-6 Units Subcutaneous TID WC   metoprolol tartrate  12.5 mg Oral BID   montelukast  10 mg Oral QHS   sodium chloride flush  3 mL Intravenous Q12H   Continuous Infusions:  piperacillin-tazobactam (ZOSYN)   IV 3.375 g (04/30/23 0502)    Diet Order             Diet NPO time specified  Diet effective midnight                  Intake/Output Summary (Last 24 hours) at 04/30/2023 0831 Last data filed at 04/30/2023 0600 Gross per 24 hour  Intake 1762.49 ml  Output 1 ml  Net 1761.49 ml   Net IO Since Admission: 3,030.59 mL [04/30/23 0831]  Wt Readings from Last 3 Encounters:  04/30/23 80.4 kg  04/19/23 72.6 kg     Unresulted Labs (From admission, onward)     Start     Ordered   04/30/23 0500  Basic metabolic panel  Daily,   R     Question:  Specimen collection method  Answer:  Lab=Lab collect   04/29/23 0955   04/30/23 0500  CBC  Daily,   R     Question:  Specimen collection method  Answer:  Lab=Lab collect   04/29/23 0955          Data Reviewed: I have personally reviewed following labs and imaging studies CBC: Recent Labs  Lab 04/27/23 1719 04/27/23 1739 04/28/23 0559  WBC 8.3  --  8.5  NEUTROABS 6.5  --   --   HGB 10.8* 11.6* 10.3*  HCT 32.3* 34.0* 30.8*  MCV 95.3  --  96.9  PLT 452*  --  439*   Basic Metabolic Panel: Recent Labs  Lab 04/27/23 1719 04/27/23 1739 04/28/23 0559 04/29/23 1259  NA 136 137 136 136  K 3.3* 3.4* 4.0 3.3*  CL 98 99 100 98  CO2 23  --  23 25  GLUCOSE 83 81 65* 85  BUN 10 7* 8 7*  CREATININE 0.45 0.40* 0.55 0.47  CALCIUM 9.3  --  9.1 8.9  MG 1.9  --  1.7  --   PHOS  --   --  3.4  --    GFR: Estimated Creatinine Clearance: 67.6 mL/min (by C-G formula based on SCr of 0.47 mg/dL). Liver Function Tests: Recent Labs  Lab 04/27/23 1719 04/28/23 0559  AST 152* 153*  ALT 57* 53*  ALKPHOS 278* 237*  BILITOT 3.6* 4.1*  PROT 6.6 6.2*  ALBUMIN 3.0* 2.8*   Recent Labs  Lab 04/27/23 1719  LIPASE 21   No results for input(s): "AMMONIA" in the last 168 hours. Coagulation Profile: Recent Labs  Lab 04/28/23 0559  INR 1.2  Sepsis Labs: Recent Labs  Lab 04/27/23 1852 04/27/23 2036 04/28/23 0039 04/28/23 0559   LATICACIDVEN 3.2* 3.4* 3.1* 2.9*    No results found for this or any previous visit (from the past 240 hour(s)).  Antimicrobials: Anti-infectives (From admission, onward)    Start     Dose/Rate Route Frequency Ordered Stop   04/28/23 0600  piperacillin-tazobactam (ZOSYN) IVPB 3.375 g        3.375 g 12.5 mL/hr over 240 Minutes Intravenous Every 8 hours 04/27/23 2332     04/27/23 2245  piperacillin-tazobactam (ZOSYN) IVPB 3.375 g        3.375 g 100 mL/hr over 30 Minutes Intravenous  Once 04/27/23 2230  04/28/23 0019      Culture/Microbiology No results found for: "SDES", "SPECREQUEST", "CULT", "REPTSTATUS"   Radiology Studies: No results found.   LOS: 3 days   Lanae Boast, MD [facility]  04/30/2023, 8:31 AM

## 2023-04-30 NOTE — Progress Notes (Signed)
Initial Nutrition Assessment  INTERVENTION:   -Boost Breeze po TID, each supplement provides 250 kcal and 9 grams of protein  -Encourage PO intakes  NUTRITION DIAGNOSIS:   Increased nutrient needs related to acute illness as evidenced by estimated needs.  GOAL:   Patient will meet greater than or equal to 90% of their needs  MONITOR:   PO intake, Supplement acceptance, Labs, Weight trends, I & O's  REASON FOR ASSESSMENT:   Malnutrition Screening Tool    ASSESSMENT:   72 year old female with hypertension diabetes COPD/asthma,DJD, bipolar disorder recent ED visits in 10/19 with suicidal ideation cleared by psychiatry presented from home with complaint of right upper quadrant abdominal pain flank pain for several days and having dark-colored urine, patient has been dealing with intermittent right upper quadrant abdominal pain over the past year and had been persistent since past few weeks.  Patient in room, laying down with eyes closed. Pt just returned from having liver biopsy this morning. Pt states she feels hungry and would like her diet advanced. RN states she will message provider to see if this can happen. Pt states she was not drinking the Boost, didn't want another kind of protein supplement. Will leave ordered just in case her PO is limited. Pt denies any issues a this time.  Per reports UBW ~160 lbs. Current weight: 177 lbs.  Medications: insulin  Labs reviewed: CBGs: 69-82   NUTRITION - FOCUSED PHYSICAL EXAM:  No depletions noted.  Diet Order:   Diet Order             Diet regular Room service appropriate? Yes; Fluid consistency: Thin  Diet effective now                   EDUCATION NEEDS:   No education needs have been identified at this time  Skin:  Skin Assessment: Reviewed RN Assessment  Last BM:  10/27  Height:   Ht Readings from Last 1 Encounters:  04/28/23 5\' 5"  (1.651 m)    Weight:   Wt Readings from Last 1 Encounters:   04/30/23 80.4 kg   BMI:  Body mass index is 29.5 kg/m.  Estimated Nutritional Needs:   Kcal:  1700-1900  Protein:  80-90g  Fluid:  1.7L/day   Tilda Franco, MS, RD, LDN Inpatient Clinical Dietitian Contact information available via Amion

## 2023-04-30 NOTE — Plan of Care (Signed)
  Problem: Education: Goal: Knowledge of General Education information will improve Description: Including pain rating scale, medication(s)/side effects and non-pharmacologic comfort measures Outcome: Progressing   Problem: Coping: Goal: Level of anxiety will decrease Outcome: Progressing   Problem: Safety: Goal: Ability to remain free from injury will improve Outcome: Progressing   Problem: Metabolic: Goal: Ability to maintain appropriate glucose levels will improve Outcome: Progressing

## 2023-05-01 DIAGNOSIS — K5909 Other constipation: Secondary | ICD-10-CM | POA: Diagnosis not present

## 2023-05-01 DIAGNOSIS — C801 Malignant (primary) neoplasm, unspecified: Secondary | ICD-10-CM

## 2023-05-01 DIAGNOSIS — C787 Secondary malignant neoplasm of liver and intrahepatic bile duct: Secondary | ICD-10-CM | POA: Diagnosis not present

## 2023-05-01 DIAGNOSIS — A419 Sepsis, unspecified organism: Secondary | ICD-10-CM | POA: Diagnosis not present

## 2023-05-01 LAB — CBC
HCT: 31.3 % — ABNORMAL LOW (ref 36.0–46.0)
Hemoglobin: 10 g/dL — ABNORMAL LOW (ref 12.0–15.0)
MCH: 31.4 pg (ref 26.0–34.0)
MCHC: 31.9 g/dL (ref 30.0–36.0)
MCV: 98.4 fL (ref 80.0–100.0)
Platelets: 426 10*3/uL — ABNORMAL HIGH (ref 150–400)
RBC: 3.18 MIL/uL — ABNORMAL LOW (ref 3.87–5.11)
RDW: 18.7 % — ABNORMAL HIGH (ref 11.5–15.5)
WBC: 7.7 10*3/uL (ref 4.0–10.5)
nRBC: 0 % (ref 0.0–0.2)

## 2023-05-01 LAB — BASIC METABOLIC PANEL
Anion gap: 12 (ref 5–15)
BUN: 10 mg/dL (ref 8–23)
CO2: 21 mmol/L — ABNORMAL LOW (ref 22–32)
Calcium: 8.7 mg/dL — ABNORMAL LOW (ref 8.9–10.3)
Chloride: 102 mmol/L (ref 98–111)
Creatinine, Ser: 0.51 mg/dL (ref 0.44–1.00)
GFR, Estimated: >60 mL/min (ref >60)
Glucose, Bld: 77 mg/dL (ref 70–99)
Potassium: 3.9 mmol/L (ref 3.5–5.1)
Sodium: 135 mmol/L (ref 135–145)

## 2023-05-01 LAB — GLUCOSE, CAPILLARY: Glucose-Capillary: 83 mg/dL (ref 70–99)

## 2023-05-01 MED ORDER — HYDROCORTISONE 1 % EX CREA
TOPICAL_CREAM | CUTANEOUS | Status: DC | PRN
  Filled 2023-05-01: qty 28

## 2023-05-01 MED ORDER — OXYCODONE HCL 5 MG PO TABS
5.0000 mg | ORAL_TABLET | Freq: Four times a day (QID) | ORAL | Status: DC | PRN
  Filled 2023-05-01 (×4): qty 1

## 2023-05-01 MED ORDER — OXYCODONE HCL 5 MG PO TABS
5.0000 mg | ORAL_TABLET | Freq: Four times a day (QID) | ORAL | Status: DC | PRN
  Administered 2023-05-02 – 2023-05-03 (×5): 5 mg via ORAL
  Filled 2023-05-01: qty 1

## 2023-05-01 MED ORDER — ORAL CARE MOUTH RINSE: 15.0000 mL | OROMUCOSAL | Status: DC | PRN

## 2023-05-01 MED ORDER — POLYETHYLENE GLYCOL 3350 17 G PO PACK
17.0000 g | PACK | Freq: Every day | ORAL | Status: DC
  Administered 2023-05-01 – 2023-05-07 (×6): 17 g via ORAL
  Filled 2023-05-01 (×7): qty 1

## 2023-05-01 NOTE — TOC Progression Note (Signed)
Transition of Care [facility]) - Progression Note    Patient Details  Name: Jeanne Mcclure MRN: [id number] Date of Birth: [birth year]  Transition of Care [facility]) CM/SW Contact  Onesti Bonfiglio, Olegario Messier, RN Phone Number: 05/01/2023, 4:06 PM  Clinical Narrative:  PT-recc ST SNF.Spoke to dtr/patient about ST SNF-agree to fax out. Awaiting MD signature on 30day note, & fl2 to send to [facility] for Pasrr.     Expected Discharge Plan: Skilled Nursing Facility Barriers to Discharge: Continued Medical Work up  Expected Discharge Plan and Services   Discharge Planning Services: CM Consult Post Acute Care Choice: Resumption of Svcs/PTA Provider Living arrangements for the past 2 months: Single Family Home                                       Social Determinants of Health (SDOH) Interventions SDOH Screenings   Food Insecurity: No Food Insecurity (04/28/2023)  Housing: Low Risk  (04/28/2023)  Transportation Needs: No Transportation Needs (04/28/2023)  Utilities: Not At Risk (04/28/2023)  Tobacco Use: High Risk (04/30/2023)    Readmission Risk Interventions     No data to display

## 2023-05-01 NOTE — TOC PASRR Note (Signed)
30 Day PASRR Note   Patient Details  Name: Jeanne Mcclure Date of Birth: 1950-11-22   Transition of Care [facility]) CM/SW Contact:    Lanier Clam, RN Phone Number: 05/01/2023, 4:05 PM  To Whom It May Concern:  Please be advised that this patient will require a short-term nursing home stay - anticipated 30 days or less for rehabilitation and strengthening.   The plan is for return home.

## 2023-05-01 NOTE — TOC Initial Note (Signed)
Transition of Care [facility]) - Initial/Assessment Note    Patient Details  Name: Jeanne Mcclure MRN: [id number] Date of Birth: 04/17/1951  Transition of Care [facility]) CM/SW Contact:    Lanier Clam, RN Phone Number: 05/01/2023, 12:09 PM  Clinical Narrative: Await PT/ot note for d/c need.                  Expected Discharge Plan: Home/Self Care (TBD) Barriers to Discharge: Continued Medical Work up   Patient Goals and CMS Choice   CMS Medicare.gov Compare Post Acute Care list provided to:: Patient Choice offered to / list presented to : Patient [place] ownership interest in [facility].provided to:: Patient    Expected Discharge Plan and Services   Discharge Planning Services: CM Consult Post Acute Care Choice: Resumption of Svcs/PTA Provider Living arrangements for the past 2 months: Single Family Home                                      Prior Living Arrangements/Services Living arrangements for the past 2 months: Single Family Home Lives with:: Adult Children Patient language and need for interpreter reviewed:: Yes        Need for Family Participation in Patient Care: Yes (Comment) Care giver support system in place?: Yes (comment)   Criminal Activity/Legal Involvement Pertinent to Current Situation/Hospitalization: No - Comment as needed  Activities of Daily Living   ADL Screening (condition at time of admission) Independently performs ADLs?: Yes (appropriate for developmental age) Is the patient deaf or have difficulty hearing?: No Does the patient have difficulty seeing, even when wearing glasses/contacts?: No Does the patient have difficulty concentrating, remembering, or making decisions?: No  Permission Sought/Granted Permission sought to share information with : Case Manager Permission granted to share information with : Yes, Verbal Permission Granted  Share Information with NAME: Case manager           Emotional Assessment Appearance::  Appears stated age Attitude/Demeanor/Rapport: Gracious Affect (typically observed): Accepting Orientation: : Oriented to Self, Oriented to Place, Oriented to  Time, Oriented to Situation Alcohol / Substance Use: Not Applicable Psych Involvement: No (comment)  Admission diagnosis:  Hypokalemia [E87.6] Hyperbilirubinemia [E80.6] Diverticulitis [K57.92] Sepsis (HCC) [A41.9] Patient Active Problem List   Diagnosis Date Noted   Sepsis (HCC) 04/27/2023   Paranoia (HCC) 04/20/2023   Conflict between patient and family 04/20/2023   PCP:  Pcp, No Pharmacy:   [id number] [place], [place] - [address] AT [facility] [address] [place] [place] [address] Phone: [phone] Fax: [phone]     Social Determinants of Health (SDOH) Social History: SDOH Screenings   Food Insecurity: No Food Insecurity (04/28/2023)  Housing: Low Risk  (04/28/2023)  Transportation Needs: No Transportation Needs (04/28/2023)  Utilities: Not At Risk (04/28/2023)  Tobacco Use: High Risk (04/30/2023)   SDOH Interventions:     Readmission Risk Interventions     No data to display

## 2023-05-01 NOTE — Plan of Care (Signed)
  Problem: Education: Goal: Knowledge of General Education information will improve Description: Including pain rating scale, medication(s)/side effects and non-pharmacologic comfort measures Outcome: Progressing   Problem: Pain Management: Goal: General experience of comfort will improve Outcome: Progressing   Problem: Safety: Goal: Ability to remain free from injury will improve Outcome: Progressing   Problem: Coping: Goal: Ability to adjust to condition or change in health will improve Outcome: Progressing   Problem: Fluid Volume: Goal: Ability to maintain a balanced intake and output will improve Outcome: Progressing

## 2023-05-01 NOTE — [place] FL2 (Signed)
[place] MEDICAID [id number] LEVEL OF CARE FORM     IDENTIFICATION  Patient Name: Jeanne Mcclure Birthdate: [birth year] Sex: female Admission Date (Current Location): 04/27/2023  [facility] and [place] Number:  Producer, television/film/video and Address:  [facility],  [address] [place] [address]      Provider Number: [id number]  Attending Physician Name and Address:  Lanae Boast, MD  Relative Name and Phone Number:  Jillyn [place](dtr)336 [phone]    Current Level of Care: Hospital Recommended Level of Care: Skilled Nursing Facility Prior Approval Number:    Date Approved/Denied:   PASRR Number:    Discharge Plan: SNF    Current Diagnoses: Patient Active Problem List   Diagnosis Date Noted   Other constipation 05/01/2023   Metastasis to liver of unknown origin (HCC) 05/01/2023   Sepsis (HCC) 04/27/2023   Paranoia (HCC) 04/20/2023   Conflict between patient and family 04/20/2023    Orientation RESPIRATION BLADDER Height & Weight     Self, Time, Situation, Place  Normal Continent Weight: 79.9 kg Height:  5\' 5"  (165.1 cm)  BEHAVIORAL SYMPTOMS/MOOD NEUROLOGICAL BOWEL NUTRITION STATUS      Continent Diet (Regular)  AMBULATORY STATUS COMMUNICATION OF NEEDS Skin   Limited Assist Verbally Normal                       Personal Care Assistance Level of Assistance  Bathing, Feeding, Dressing Bathing Assistance: Limited assistance Feeding assistance: Limited assistance Dressing Assistance: Limited assistance     Functional Limitations Info  Sight, Hearing, Speech Sight Info: Impaired (eyeglasses) Hearing Info: Adequate Speech Info: Adequate    SPECIAL CARE FACTORS FREQUENCY  PT (By licensed PT), OT (By licensed OT)     PT Frequency: 3x week OT Frequency: 3x week            Contractures Contractures Info: Not present    Additional Factors Info  Code Status, Allergies Code Status Info: Full Allergies Info: Hydrocodone, Peanut-containing Drug Products,  Codeine, Egg Solids, Whole, Fruit Extracts, Iodine, Penicillins           Current Medications (05/01/2023):  This is the current hospital active medication list Current Facility-Administered Medications  Medication Dose Route Frequency Provider Last Rate Last Admin   acetaminophen (TYLENOL) tablet 1,000 mg  1,000 mg Oral Q6H PRN Segars, Christiane Ha, MD       albuterol (PROVENTIL) (2.5 MG/3ML) 0.083% nebulizer solution 2.5 mg  2.5 mg Nebulization Q4H PRN Segars, Christiane Ha, MD       feeding supplement (BOOST / RESOURCE BREEZE) liquid 1 Container  1 Container Oral TID BM Lanae Boast, MD   1 Container at 05/01/23 1013   gabapentin (NEURONTIN) capsule 200 mg  200 mg Oral QHS Segars, Christiane Ha, MD   200 mg at 04/28/23 2229   hydrocortisone cream 1 %   Topical PRN Lanae Boast, MD   Given at 05/01/23 0143   insulin aspart (novoLOG) injection 0-6 Units  0-6 Units Subcutaneous TID WC Kc, Ramesh, MD       ketorolac (TORADOL) 15 MG/ML injection 15 mg  15 mg Intravenous Q6H PRN Kc, Ramesh, MD   15 mg at 04/30/23 0406   metoprolol tartrate (LOPRESSOR) tablet 12.5 mg  12.5 mg Oral BID Kc, Ramesh, MD   12.5 mg at 05/01/23 1013   montelukast (SINGULAIR) tablet 10 mg  10 mg Oral QHS Dolly Rias, MD   10 mg at 04/30/23 2148   ondansetron (ZOFRAN) injection 4 mg  4 mg Intravenous Q6H PRN Gloris Manchester, MD   4 mg at 04/27/23 1719   Oral care mouth rinse  15 mL Mouth Rinse PRN Kc, Dayna Barker, MD       oxyCODONE (Oxy IR/ROXICODONE) immediate release tablet 5 mg  5 mg Oral Q6H PRN Kc, Ramesh, MD       Or   oxyCODONE (Oxy IR/ROXICODONE) immediate release tablet 5 mg  5 mg Oral Q6H PRN Kc, Ramesh, MD       piperacillin-tazobactam (ZOSYN) IVPB 3.375 g  3.375 g Intravenous Q8H Segars, Christiane Ha, MD 12.5 mL/hr at 05/01/23 1322 3.375 g at 05/01/23 1322   polyethylene glycol (MIRALAX / GLYCOLAX) packet 17 g  17 g Oral Daily Gorsuch, Ni, MD   17 g at 05/01/23 1319   sodium chloride flush (NS) 0.9 % injection 3 mL  3 mL Intravenous  Q12H Dolly Rias, MD   3 mL at 05/01/23 1013     Discharge Medications: Please see discharge summary for a list of discharge medications.  Relevant Imaging Results:  Relevant Lab Results:   Additional Information ss#[id number]  Arcelia Pals, Olegario Messier, [place]

## 2023-05-01 NOTE — Plan of Care (Signed)
  Problem: Education: Goal: Knowledge of General Education information will improve Description Including pain rating scale, medication(s)/side effects and non-pharmacologic comfort measures Outcome: Completed/Met   Problem: Clinical Measurements: Goal: Respiratory complications will improve Outcome: Completed/Met   Problem: Activity: Goal: Risk for activity intolerance will decrease Outcome: Completed/Met

## 2023-05-01 NOTE — Consult Note (Signed)
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  She would need close follow-up with local physician, preferably with palliative care and hospice services available when she is discharged.  I will consult palliative care team  Discharge planning Not ready, hopefully within the next 24 to 48 hours  The total time spent in the appointment was 80 minutes encounter with patients including review of chart and various tests results, discussions about plan of care and coordination of care plan   All questions were answered. The patient knows to call the clinic with any problems, questions or concerns. No barriers to learning was detected.  Artis Delay, MD 10/31/202412:43 PM  CHIEF COMPLAINTS/PURPOSE OF CONSULTATION:  Metastatic disease in the liver, for further evaluation  HISTORY OF PRESENTING ILLNESS:  Jeanne Mcclure 72 y.o. female is here because of  recent findings of abnormal liver imaging.  The patient has completed liver biopsy, result is pending  The patient originally lives in [place].  She had smoking and drinking history.  She was recently placed in the behavioral health facility due to mental health issue.  She was living with her daughter here for the last 3 weeks, trying to take care of her.  Her daughter is suffering from cancer. She has been having abdominal pain for a long time.  She has been inconsistent with her story, felt that her pain might be there for at least a year but worse in the last 2 weeks.  She has also been having intermittent headaches.  She felt that the pain is best described within EMP pulse directing to her liver and felt sharp.  The patient has a blanket on her bed and she has been using the blanket to shield herself from the EMP pulse.  She has also been wearing a helmet to prevent EMP pulse from hitting her head.  She also has been having headaches but denies new neurological deficits.  She had history of abnormal mammogram many years ago but has never had biopsy.  She said it was described as a knot in her breast.  Her last mammogram was several years ago.  The patient have history of heavy smoking and drinking.  She had remote history of colonoscopy, results are not available.  She had chronic constipation.  Denies abnormal Pap smear or uterine bleeding  She would like to know her prognosis without treatment.  The patient has expressed interest not to pursue treatment  Pain is better controlled since admission  MEDICAL HISTORY:  Past Medical History:  Diagnosis Date   Asthma    COPD (chronic obstructive pulmonary disease) (HCC)    COVID-19 virus detected 08/2019   Hypertension     SURGICAL HISTORY: Past Surgical History:  Procedure Laterality Date   ROTATOR CUFF REPAIR  2010   TUBAL LIGATION  1982    SOCIAL HISTORY: Social History   Socioeconomic History   Marital status: Divorced    Spouse  name: Not on file   Number of children: Not on file   Years of education: Not on file   Highest education level: Not on file  Occupational History   Not on file  Tobacco Use   Smoking status: Every Day    Current packs/day: 0.50    Average packs/day: 0.5 packs/day for 0.8 years (0.4 ttl pk-yrs)    Types: Cigarettes    Start date: 2024    Last attempt to quit: 09/04/2013   Smokeless tobacco: Never  Vaping Use   Vaping status: Never Used  Substance and Sexual Activity   Alcohol use: No   Drug use: No   Sexual activity: Not on file  Other Topics Concern   Not on file  Social History Narrative   Not on file   Social Determinants of Health   Financial Resource Strain: Not on file  Food Insecurity: No Food Insecurity (04/28/2023)   Hunger Vital Sign    Worried About Running Out of Food in the Last Year: Never true    Ran Out of Food in the Last Year: Never true  Transportation Needs: No Transportation Needs (04/28/2023)   PRAPARE - Administrator, Civil Service (Medical): No    Lack of Transportation (Non-Medical): No  Physical Activity: Not on file  Stress: Not on file  Social Connections: Not on file  Intimate Partner Violence: Not At Risk (04/28/2023)   Humiliation, Afraid, Rape, and Kick questionnaire    Fear of Current or Ex-Partner: No    Emotionally Abused: No    Physically Abused: No    Sexually Abused: No    FAMILY HISTORY: Family History  Problem Relation Age of Onset   Heart failure Mother    Heart failure Father     ALLERGIES:  is allergic to hydrocodone; peanut-containing drug products; codeine; egg solids, whole; fruit extracts; iodine; and penicillins.  MEDICATIONS:  Current Facility-Administered Medications  Medication Dose Route Frequency Provider Last Rate Last Admin   acetaminophen (TYLENOL) tablet 1,000 mg  1,000 mg Oral Q6H PRN Segars, Christiane Ha, MD       albuterol (PROVENTIL) (2.5 MG/3ML) 0.083% nebulizer solution 2.5 mg  2.5 mg  Nebulization Q4H PRN Segars, Christiane Ha, MD       feeding supplement (BOOST / RESOURCE BREEZE) liquid 1 Container  1 Container Oral TID BM Kc, Ramesh, MD   1 Container at 05/01/23 1013   gabapentin (NEURONTIN) capsule 200 mg  200 mg Oral QHS Segars, Christiane Ha, MD   200 mg at 04/28/23 2229   hydrocortisone cream 1 %   Topical PRN Lanae Boast, MD   Given at 05/01/23 0143   insulin aspart (novoLOG) injection 0-6 Units  0-6 Units Subcutaneous TID WC Kc, Ramesh, MD       ketorolac (TORADOL) 15 MG/ML injection 15 mg  15 mg Intravenous Q6H PRN Kc, Ramesh, MD   15 mg at 04/30/23 0406   metoprolol tartrate (LOPRESSOR) tablet 12.5 mg  12.5 mg Oral BID Kc, Dayna Barker, MD   12.5 mg at 05/01/23  1013   montelukast (SINGULAIR) tablet 10 mg  10 mg Oral QHS Dolly Rias, MD   10 mg at 04/30/23 2148   ondansetron [facility]) injection 4 mg  4 mg Intravenous Q6H PRN Gloris Manchester, MD   4 mg at 04/27/23 1719   Oral care mouth rinse  15 mL Mouth Rinse PRN Kc, Dayna Barker, MD       oxyCODONE (Oxy IR/ROXICODONE) immediate release tablet 5 mg  5 mg Oral Q6H PRN Kc, Ramesh, MD       Or   oxyCODONE (Oxy IR/ROXICODONE) immediate release tablet 5 mg  5 mg Oral Q6H PRN Kc, Ramesh, MD       piperacillin-tazobactam (ZOSYN) IVPB 3.375 g  3.375 g Intravenous Bebe Liter, MD 12.5 mL/hr at 05/01/23 0535 3.375 g at 05/01/23 0535   polyethylene glycol (MIRALAX / GLYCOLAX) packet 17 g  17 g Oral Daily Seana Underwood, MD       sodium chloride flush (NS) 0.9 % injection 3 mL  3 mL Intravenous Q12H Dolly Rias, MD   3 mL at 05/01/23 1013    REVIEW OF SYSTEMS:   Constitutional: Denies fevers, chills or abnormal night sweats Eyes: Denies blurriness of vision, double vision or watery eyes Ears, nose, mouth, throat, and face: Denies mucositis or sore throat Respiratory: Denies cough, dyspnea or wheezes Cardiovascular: Denies palpitation, chest discomfort or lower extremity swelling Skin: Denies abnormal skin rashes Lymphatics: Denies new  lymphadenopathy or easy bruising Neurological:Denies numbness, tingling or new weaknesses Behavioral/Psych: Mood is stable, no new changes  All other systems were reviewed with the patient and are negative.  PHYSICAL EXAMINATION: ECOG PERFORMANCE STATUS: 2 - Symptomatic, <50% confined to bed  Vitals:   04/30/23 2112 05/01/23 0611  BP: 125/62 125/69  Pulse: 94 (!) 101  Resp:  15  Temp: 98.8 F (37.1 C) 98.3 F (36.8 C)  SpO2: 97% 96%   Filed Weights   04/29/23 0500 04/30/23 0500 05/01/23 0500  Weight: 178 lb 5.6 oz (80.9 kg) 177 lb 4 oz (80.4 kg) 176 lb 2.4 oz (79.9 kg)    GENERAL:alert, no distress and comfortable SKIN: skin color, texture, turgor are normal, no rashes or significant lesions EYES: normal, conjunctiva are pink and non-injected, sclera clear OROPHARYNX:no exudate, no erythema and lips, buccal mucosa, and tongue normal  NECK: supple, thyroid normal size, non-tender, without nodularity LYMPH:  no palpable lymphadenopathy in the cervical, axillary or inguinal LUNGS: clear to auscultation and percussion with normal breathing effort HEART: regular rate & rhythm and no murmurs and no lower extremity edema ABDOMEN:abdomen soft, non-tender and normal bowel sounds Musculoskeletal:no cyanosis of digits and no clubbing  PSYCH: alert & oriented x 3 with fluent speech NEURO: no focal motor/sensory deficits Bilateral breast examination does not review any abnormalities except for a large surgical scar on the left breast from previous trauma  LABORATORY DATA:  I have reviewed the data as listed Lab Results  Component Value Date   WBC 7.7 05/01/2023   HGB 10.0 (L) 05/01/2023   HCT 31.3 (L) 05/01/2023   MCV 98.4 05/01/2023   PLT 426 (H) 05/01/2023   Recent Labs    04/19/23 1151 04/27/23 1719 04/27/23 1739 04/28/23 0559 04/29/23 1259 04/30/23 1203 05/01/23 0458  NA 138 136   < > 136 136 136 135  K 3.6 3.3*   < > 4.0 3.3* 3.6 3.9  CL 100 98   < > 100 98 102 102   CO2 22 23  --  23 25 23  21*  GLUCOSE 92 83   < > 65* 85 77 77  BUN 9 10   < > 8 7* 6* 10  CREATININE 0.68 0.45   < > 0.55 0.47 0.50 0.51  CALCIUM 9.4 9.3  --  9.1 8.9 8.3* 8.7*  GFRNONAA >60 >60  --  >60 >60 >60 >60  PROT 6.7 6.6  --  6.2*  --   --   --   ALBUMIN 3.1* 3.0*  --  2.8*  --   --   --   AST 132* 152*  --  153*  --   --   --   ALT 56* 57*  --  53*  --   --   --   ALKPHOS 300* 278*  --  237*  --   --   --   BILITOT 1.4* 3.6*  --  4.1*  --   --   --   BILIDIR  --  1.9*  --  2.3*  --   --   --   IBILI  --  1.7*  --  1.8*  --   --   --    < > = values in this interval not displayed.    RADIOGRAPHIC STUDIES: I have personally reviewed the radiological images as listed and agreed with the findings in the report. US BIOPSY (LIVER)  Result Date: 04/30/2023 CLINICAL DATA:  Weight loss, right upper abdominal pain, flank pain, multiple liver lesions EXAM: ULTRASOUND-GUIDED CORE LIVER BIOPSY TECHNIQUE: An ultrasound guided liver biopsy was thoroughly discussed with the patient and questions were answered. The benefits, risks, alternatives, and complications were also discussed. The patient understands and wishes to proceed with the procedure. A verbal as well as written consent was obtained. Survey ultrasound of the liver was performed, representative lesion localized, and an appropriate skin entry site was determined. Skin site was marked, prepped with chlorhexidine, and draped in usual sterile fashion, and infiltrated locally with 1% lidocaine. Intravenous Fentanyl and Versed 2mg  were administered as conscious sedation during continuous monitoring of the patient's level of consciousness and physiological / cardiorespiratory status by the radiology RN, with a total moderate sedation time of 10 minutes. A 17 gauge trocar needle was advanced under ultrasound guidance into the liver to the margin of the lesion. 2 solid-appearing 3 cm coaxial 18gauge core samples were then obtained through  the guide needle. The guide needle was removed. Post procedure scans demonstrate no apparent complication. COMPLICATIONS: COMPLICATIONS None immediate FINDINGS: Multiple liver lesions identified. Representative right lobe lesion was approach for core biopsy sampling as above. IMPRESSION: 1. Technically successful ultrasound guided core liver lesion biopsy. Electronically Signed   By: Corlis Leak M.D.   On: 04/30/2023 15:44   MR ABDOMEN MRCP W WO CONTAST  Result Date: 04/28/2023 CLINICAL DATA:  72 year old female with history of right upper quadrant abdominal pain. Possible metastatic disease to the liver noted on prior CT examination. Follow-up study. EXAM: MRI ABDOMEN WITHOUT AND WITH CONTRAST (INCLUDING MRCP) TECHNIQUE: Multiplanar multisequence MR imaging of the abdomen was performed both before and after the administration of intravenous contrast. Heavily T2-weighted images of the biliary and pancreatic ducts were obtained, and three-dimensional MRCP images were rendered by post processing. CONTRAST:  7mL GADAVIST GADOBUTROL 1 MMOL/ML IV SOLN COMPARISON:  No prior abdominal MRI. CT of the chest, abdomen and pelvis 04/27/2023. FINDINGS: Lower chest: Small right pleural effusion lying dependently. Hepatobiliary: Scattered throughout the liver there are innumerable predominantly T1  hypointense, heterogeneous T2 signal intensity (centrally T2 hypointense with variable degrees of internal and peripheral T2 hyperintensity), and generally hypovascular lesions scattered throughout the hepatic parenchyma, all of which restrict diffusion, highly concerning for diffuse metastatic disease to the liver. Among the largest lesions is a dominant lesion in the superior aspect of the left lobe of the liver centered predominantly in segment 4A (axial image 28 of series 19) measuring 6.1 x 5.0 cm. The liver is diffusely enlarged measuring 25.5 cm in craniocaudal span and has a very nodular contour, likely reflective of  metastatic disease (although underlying cirrhosis is not excluded). No intra or extrahepatic biliary ductal dilatation is noted. Gallbladder is unremarkable in appearance. Pancreas: No pancreatic mass. No pancreatic ductal dilatation. No pancreatic or peripancreatic fluid collections or inflammatory changes. Spleen:  Unremarkable. Adrenals/Urinary Tract: Bilateral kidneys and adrenal glands are normal in appearance. No hydroureteronephrosis in the visualized portions of the abdomen. Stomach/Bowel: Visualized portions are unremarkable. Vascular/Lymphatic: No aneurysm identified in the visualized abdominal vasculature. No lymphadenopathy noted in the abdomen. Other:  Trace volume of perihepatic ascites. Musculoskeletal: No aggressive appearing osseous lesions are noted in the visualized portions of the skeleton. IMPRESSION: 1. Severe hepatomegaly with infiltrative pattern of innumerable hypovascular lesions throughout the liver, presumably reflective of widespread metastatic disease. No definite primary source confidently identified on today's examination. 2. Small right pleural effusion lying dependently. Electronically Signed   By: Trudie Reed M.D.   On: 04/28/2023 15:31   CT CHEST ABDOMEN PELVIS WO CONTRAST  Result Date: 04/27/2023 CLINICAL DATA:  Unintended weight loss, right upper quadrant pain and flank pain for several days. Dark colored urine. EXAM: CT CHEST, ABDOMEN AND PELVIS WITHOUT CONTRAST TECHNIQUE: Multidetector CT imaging of the chest, abdomen and pelvis was performed following the standard protocol without IV contrast. RADIATION DOSE REDUCTION: This exam was performed according to the departmental dose-optimization program which includes automated exposure control, adjustment of the mA and/or kV according to patient size and/or use of iterative reconstruction technique. COMPARISON:  Same day ultrasound FINDINGS: CT CHEST FINDINGS Cardiovascular: No pericardial effusion. Coronary artery and  aortic atherosclerotic calcification. Normal caliber thoracic aorta. Mediastinum/Nodes: Trachea and esophagus are unremarkable. No thoracic adenopathy noting decreased sensitivity on noncontrast exam. Lungs/Pleura: Bibasilar atelectasis/scarring. Trace right pleural effusion. No pneumothorax. Musculoskeletal: No acute fracture or destructive osseous lesion. CT ABDOMEN PELVIS FINDINGS Hepatobiliary: The liver is enlarged and contains multiple poorly defined hypoattenuating lesions. These are incompletely evaluated without IV contrast. Small amount of subcapsular fluid about the anterior right hepatic lobe. Contracted gallbladder. No biliary dilation. Pancreas: Unremarkable. No pancreatic ductal dilatation or surrounding inflammatory changes. Spleen: Unremarkable. Adrenals/Urinary Tract: Normal adrenal glands and kidneys. Unremarkable bladder. Stomach/Bowel: Normal caliber large and small bowel. Stranding and fluid about a diverticulum at the hepatic flexure (circa series 8/image 50.) normal appendix. Stomach is within normal limits. Vascular/Lymphatic: No evidence of lymphadenopathy on noncontrast exam. Aortic atherosclerotic calcification. Reproductive: Calcified uterine fibroids. Prominent ovaries are normal assessed without contrast. Other: Small volume perihepatic and pelvic free fluid. No free intraperitoneal air. Musculoskeletal: No acute fracture or destructive osseous lesion. Age related spondylosis. Grade 1 anterolisthesis of L4. No destructive osseous lesion. Degenerative changes at the pubic symphysis. IMPRESSION: 1. Hepatomegaly with multiple poorly defined hypoattenuating lesions. These are incompletely evaluated without IV contrast but are concerning for malignancy. Recommend further evaluation with liver protocol MRI. 2. Acute uncomplicated diverticulitis at the hepatic flexure. 3. Small volume perihepatic and pelvic free fluid. Aortic Atherosclerosis (ICD10-I70.0). Electronically Signed   By: Joselyn Glassman  Stutzman M.D.   On: 04/27/2023 22:11   US Abdomen Limited RUQ (LIVER/GB)  Result Date: 04/27/2023 CLINICAL DATA:  Right upper quadrant pain for 2 weeks EXAM: ULTRASOUND ABDOMEN LIMITED RIGHT UPPER QUADRANT COMPARISON:  None Available. FINDINGS: Gallbladder: Gallbladder is contracted, with nonspecific gallbladder wall thickening measuring up to 5 mm. No evidence of cholelithiasis. Negative sonographic [place] sign. Common bile duct: Diameter: 2 mm Liver: Evaluation of the liver parenchyma is limited due to respiratory motion. Liver is enlarged and heterogeneous, with multiple heterogeneous hyperechoic masslike areas identified. Largest in the right lobe measures 5.4 cm, and these masslike areas displaces the hepatic vasculature and do not demonstrate any significant increased Doppler flow. Dedicated liver CT is recommended for further evaluation. Portal vein is patent on color Doppler imaging with normal direction of blood flow towards the liver. Other: None. IMPRESSION: 1. Gallbladder is contracted, limiting its evaluation. Gallbladder wall thickening measuring up to 5 mm is a nonspecific finding, with no other sonographic evidence of cholelithiasis or cholecystitis. 2. Diffuse heterogeneity throughout the liver parenchyma, with hyperechoic masslike areas as above. Sonographic appearance is nonspecific, and dedicated liver CT with without IV contrast is recommended. Electronically Signed   By: Sharlet Salina M.D.   On: 04/27/2023 17:51

## 2023-05-01 NOTE — Progress Notes (Signed)
PROGRESS NOTE Jeanne Mcclure  UYQ:[id number] DOB: 1950/12/27 DOA: 04/27/2023 PCP: Oneita Hurt, No  Brief Narrative/Hospital Course: 72 year old female with hypertension diabetes COPD/asthma,DJD, bipolar disorder recent ED visits in 10/19 with suicidal ideation cleared by psychiatry presented from home with complaint of right upper quadrant abdominal pain flank pain for several days and having dark-colored urine, patient has been dealing with intermittent right upper quadrant abdominal pain over the past year and had been persistent since past few weeks. MRCP showed>Severe hepatomegaly with infiltrative pattern of innumerable hypovascular lesions throughout the liver, presumably reflective of widespread metastatic disease. No definite primary source confidently identified on today's examination.  GI consulted advised treatment of diverticulitis and biopsy- s/p ultrasound-guided biopsy 10/30. At this time tolerating diet, oncology consulted with plan for outpatient follow-up and further treatment.  It appears patient may not be interested in treatment and she may be interested to have further care at [facility]. 1 discussed with the patient patient agreed to follow-up outpatient oncology but seem clearly mentioned that she is not interested in treatment and only wants to know the prognosis    Subjective: Patient seen and examined this morning  Resting, complains some headache, ongoing pain issues.   She reports she wants to go to skilled nursing rehab.    assessment and Plan: Principal Problem:   Sepsis (HCC) Active Problems:   Other constipation   Metastasis to liver of unknown origin (HCC)   Severe hepatomegaly with infiltrative innumerable hypervascular lesions Concerning for metastatic disease widespread in the liver Intrahepatic cholestasis: MRCP done- reviewed w/ with GI Dr. Elnoria Howard did not advise colonoscopy advised IR evaluation for liver biopsy and IR has been consulted.It seems patient did not  follow-up on her follow-up colonoscopy with Dr. Elnoria Howard in the past. Concern about possible colon malignancy.  Patient underwent successful ultrasound-guided biopsy 10/30, hematology oncology consulted today it seems patient is not interested in treatment, given her headache MRI brain also ordered.  Continue pain management oral and IV opiates.  Acute diverticulitis at the hepatic flexure Severe sepsis secondary to above POA: Presented with tachycardia tachypnea and lactic acidosis and met severe sepsis criteria.  Tolerating diet at this time continue on Zosyn. Discussed with Dr. Elnoria Howard he does not feel patient can tolerate colonoscopy at this time given acute diverticulitis  Normocytic anemia: hemoglobin at 10.3 g, monitor. Recent Labs  Lab 04/27/23 1719 04/27/23 1739 04/28/23 0559 04/30/23 1203 05/01/23 0458  HGB 10.8* 11.6* 10.3* 9.1* 10.0*  HCT 32.3* 34.0* 30.8* 28.0* 31.3*   HTN: BP stable. Cont low-dose metoprolol.    Diabetes mellitus diet controlled Hypoglycemia: Resolved.weaned off ivf. Cont po Recent Labs  Lab 04/28/23 2230 04/29/23 0342 04/29/23 0736 04/29/23 1640 05/01/23 0807  GLUCAP 80 74 69* 82 83    Bipolar disorder: Mood is stable.  Not on meds.  Recent episode of suicidal ideation and was cleared by psychiatry.  DVT prophylaxis: SCDs Start: 04/27/23 2324 Code Status:   Code Status: Full Code Family Communication: plan of care discussed with patient at bedside. Patient status is: Inpatient because of sepsis, liver lesion Level of care: Telemetry   Dispo: The patient is from: HOME            Anticipated disposition: DC consulted for resources patient interested to go to skilled nursing facility.she meets criteria.  PT OT consulted further. Objective: Vitals last 24 hrs: Vitals:   04/30/23 1358 04/30/23 2112 05/01/23 0500 05/01/23 0611  BP: 137/70 125/62  125/69  Pulse:  94  (!) 101  Resp:    15  Temp: 98.3 F (36.8 C) 98.8 F (37.1 C)  98.3 F (36.8 C)   TempSrc: Oral Oral  Oral  SpO2: 98% 97%  96%  Weight:   79.9 kg   Height:       Weight change: -0.5 kg  Physical Examination: General exam: alert awake, oriented  HEENT:Oral mucosa moist, Ear/Nose WNL grossly Respiratory system: Bilaterally clear BS,no use of accessory muscle Cardiovascular system: S1 & S2 +, No JVD. Gastrointestinal system: Abdomen soft,NT,ND, BS+ Nervous System: Alert, awake, moving all extremities,and following commands. Extremities: LE edema neg,distal peripheral pulses palpable and warm.  Skin: No rashes,no icterus. MSK: Normal muscle bulk,tone, power   Medications reviewed: Scheduled Meds:  feeding supplement  1 Container Oral TID BM   gabapentin  200 mg Oral QHS   insulin aspart  0-6 Units Subcutaneous TID WC   metoprolol tartrate  12.5 mg Oral BID   montelukast  10 mg Oral QHS   polyethylene glycol  17 g Oral Daily   sodium chloride flush  3 mL Intravenous Q12H  Continuous Infusions:  piperacillin-tazobactam (ZOSYN)  IV 3.375 g (05/01/23 1322)    Diet Order             Diet regular Room service appropriate? Yes; Fluid consistency: Thin  Diet effective now                  Intake/Output Summary (Last 24 hours) at 05/01/2023 1333 Last data filed at 05/01/2023 1322 Gross per 24 hour  Intake 882.48 ml  Output --  Net 882.48 ml   Net IO Since Admission: 3,913.07 mL [05/01/23 1333]  Wt Readings from Last 3 Encounters:  05/01/23 79.9 kg  04/19/23 72.6 kg     Unresulted Labs (From admission, onward)     Start     Ordered   05/02/23 0500  AFP tumor marker  Tomorrow morning,   R       Question:  Specimen collection method  Answer:  Lab=Lab collect   05/01/23 1225   05/02/23 0500  Cancer antigen 19-9  Tomorrow morning,   R       Question:  Specimen collection method  Answer:  Lab=Lab collect   05/01/23 1225   05/02/23 0500  CEA  Tomorrow morning,   R       Question:  Specimen collection method  Answer:  Lab=Lab collect   05/01/23 1225    04/30/23 0500  Basic metabolic panel  Daily,   R     Question:  Specimen collection method  Answer:  Lab=Lab collect   04/29/23 0955   04/30/23 0500  CBC  Daily,   R     Question:  Specimen collection method  Answer:  Lab=Lab collect   04/29/23 0955          Data Reviewed: I have personally reviewed following labs and imaging studies CBC: Recent Labs  Lab 04/27/23 1719 04/27/23 1739 04/28/23 0559 04/30/23 1203 05/01/23 0458  WBC 8.3  --  8.5 6.9 7.7  NEUTROABS 6.5  --   --   --   --   HGB 10.8* 11.6* 10.3* 9.1* 10.0*  HCT 32.3* 34.0* 30.8* 28.0* 31.3*  MCV 95.3  --  96.9 98.6 98.4  PLT 452*  --  439* 426* 426*   Basic Metabolic Panel: Recent Labs  Lab 04/27/23 1719 04/27/23 1739 04/28/23 0559 04/29/23 1259 04/30/23 1203 05/01/23 0458  NA 136 137 136 136 136 135  K 3.3* 3.4* 4.0 3.3* 3.6 3.9  CL 98 99 100 98 102 102  CO2 23  --  23 25 23  21*  GLUCOSE 83 81 65* 85 77 77  BUN 10 7* 8 7* 6* 10  CREATININE 0.45 0.40* 0.55 0.47 0.50 0.51  CALCIUM 9.3  --  9.1 8.9 8.3* 8.7*  MG 1.9  --  1.7  --   --   --   PHOS  --   --  3.4  --   --   --   GFR: Estimated Creatinine Clearance: 67.4 mL/min (by C-G formula based on SCr of 0.51 mg/dL). Liver Function Tests: Recent Labs  Lab 04/27/23 1719 04/28/23 0559  AST 152* 153*  ALT 57* 53*  ALKPHOS 278* 237*  BILITOT 3.6* 4.1*  PROT 6.6 6.2*  ALBUMIN 3.0* 2.8*   Recent Labs  Lab 04/27/23 1719  LIPASE 21  No results for input(s): "AMMONIA" in the last 168 hours. Coagulation Profile: Recent Labs  Lab 04/28/23 0559  INR 1.2  Sepsis Labs: Recent Labs  Lab 04/27/23 1852 04/27/23 2036 04/28/23 0039 04/28/23 0559  LATICACIDVEN 3.2* 3.4* 3.1* 2.9*    No results found for this or any previous visit (from the past 240 hour(s)).  Antimicrobials: Anti-infectives (From admission, onward)    Start     Dose/Rate Route Frequency Ordered Stop   04/28/23 0600  piperacillin-tazobactam (ZOSYN) IVPB 3.375 g        3.375  g 12.5 mL/hr over 240 Minutes Intravenous Every 8 hours 04/27/23 2332     04/27/23 2245  piperacillin-tazobactam (ZOSYN) IVPB 3.375 g        3.375 g 100 mL/hr over 30 Minutes Intravenous  Once 04/27/23 2230 04/28/23 0019     Culture/Microbiology No results found for: "SDES", "SPECREQUEST", "CULT", "REPTSTATUS"  Radiology Studies: US BIOPSY (LIVER)  Result Date: 04/30/2023 CLINICAL DATA:  Weight loss, right upper abdominal pain, flank pain, multiple liver lesions EXAM: ULTRASOUND-GUIDED CORE LIVER BIOPSY TECHNIQUE: An ultrasound guided liver biopsy was thoroughly discussed with the patient and questions were answered. The benefits, risks, alternatives, and complications were also discussed. The patient understands and wishes to proceed with the procedure. A verbal as well as written consent was obtained. Survey ultrasound of the liver was performed, representative lesion localized, and an appropriate skin entry site was determined. Skin site was marked, prepped with chlorhexidine, and draped in usual sterile fashion, and infiltrated locally with 1% lidocaine. Intravenous Fentanyl and Versed 2mg  were administered as conscious sedation during continuous monitoring of the patient's level of consciousness and physiological / cardiorespiratory status by the radiology RN, with a total moderate sedation time of 10 minutes. A 17 gauge trocar needle was advanced under ultrasound guidance into the liver to the margin of the lesion. 2 solid-appearing 3 cm coaxial 18gauge core samples were then obtained through the guide needle. The guide needle was removed. Post procedure scans demonstrate no apparent complication. COMPLICATIONS: COMPLICATIONS None immediate FINDINGS: Multiple liver lesions identified. Representative right lobe lesion was approach for core biopsy sampling as above. IMPRESSION: 1. Technically successful ultrasound guided core liver lesion biopsy. Electronically Signed   By: Corlis Leak M.D.    On: 04/30/2023 15:44     LOS: 4 days   Lanae Boast, MD [facility]  05/01/2023, 1:33 PM

## 2023-05-01 NOTE — Progress Notes (Signed)
Physical Therapy Treatment Patient Details Name: Jeanne Mcclure MRN: [id number] DOB: Sep 23, 1950 Today's Date: 05/01/2023   History of Present Illness 72 y.o. female with hx hypertension, diabetes, COPD, degenerative arthritis, bipolar disorder, recent ED visit 10/19 with suicidal ideation ultimately cleared by psychiatry, who presents due to persistent right upper quadrant pain.  Found to have multiple hypodense liver lesions, suspected metastases with unknown primary.  Septic likely from intrahepatic cholestasis.    PT Comments  The patient reporting abdominal pain, moves guardedly and slowly. Patient ambulated  with IV pole, patient reports that she desires to be able to care for herself . Currently   has limited  support and needs to be safe and independent in all ADL's. Patient will benefit from continued inpatient follow up therapy, <3 hours/day     If plan is discharge home, recommend the following: Assist for transportation;A little help with walking and/or transfers;A little help with bathing/dressing/bathroom   Can travel by private vehicle     Yes  Equipment Recommendations  Rollator (4 wheels)    Recommendations for Other Services       Precautions / Restrictions Precautions Precautions: Fall     Mobility  Bed Mobility Overal bed mobility: Modified Independent                  Transfers Overall transfer level: Independent                      Ambulation/Gait Ambulation/Gait assistance: Contact guard assist Gait Distance (Feet): 100 Feet Assistive device: IV Pole Gait Pattern/deviations: Step-through pattern       General Gait Details: gait is slow, requires support, Rollator will benefit   Stairs             Wheelchair Mobility     Tilt Bed    Modified Rankin (Stroke Patients Only)       Balance Overall balance assessment: Mild deficits observed, not formally tested                                           Cognition Arousal: Alert Behavior During Therapy: Flat affect Overall Cognitive Status: Within Functional Limits for tasks assessed                                          Exercises      General Comments        Pertinent Vitals/Pain Pain Assessment Pain Score: 6  Pain Location: RUQ Pain Descriptors / Indicators: Sore, Cramping Pain Intervention(s): Repositioned, Premedicated before session    Home Living                          Prior Function            PT Goals (current goals can now be found in the care plan section) Progress towards PT goals: Progressing toward goals    Frequency    Min 1X/week      PT Plan      Co-evaluation              AM-PAC PT "6 Clicks" Mobility   Outcome Measure  Help needed turning from your back to your side while in a flat bed without using bedrails?: None  Help needed moving from lying on your back to sitting on the side of a flat bed without using bedrails?: None Help needed moving to and from a bed to a chair (including a wheelchair)?: None Help needed standing up from a chair using your arms (e.g., wheelchair or bedside chair)?: None Help needed to walk in hospital room?: A Little Help needed climbing 3-5 steps with a railing? : A Lot 6 Click Score: 21    End of Session   Activity Tolerance: Patient tolerated treatment well Patient left: in chair;with call bell/phone within reach Nurse Communication: Mobility status PT Visit Diagnosis: Difficulty in walking, not elsewhere classified (R26.2);Muscle weakness (generalized) (M62.81);Pain     Time: 1410-1438 PT Time Calculation (min) (ACUTE ONLY): 28 min  Charges:    $Gait Training: 23-37 mins PT General Charges $$ ACUTE PT VISIT: 1 Visit                     Blanchard Kelch PT Acute Rehabilitation Services Office [phone] Weekend pager-[phone]    Rada Hay 05/01/2023, 3:38 PM

## 2023-05-02 ENCOUNTER — Inpatient Hospital Stay (HOSPITAL_COMMUNITY): Payer: Medicare HMO

## 2023-05-02 DIAGNOSIS — A419 Sepsis, unspecified organism: Secondary | ICD-10-CM | POA: Diagnosis not present

## 2023-05-02 DIAGNOSIS — K5909 Other constipation: Secondary | ICD-10-CM | POA: Diagnosis not present

## 2023-05-02 DIAGNOSIS — Z7189 Other specified counseling: Secondary | ICD-10-CM | POA: Diagnosis not present

## 2023-05-02 DIAGNOSIS — K5792 Diverticulitis of intestine, part unspecified, without perforation or abscess without bleeding: Secondary | ICD-10-CM

## 2023-05-02 DIAGNOSIS — Z515 Encounter for palliative care: Secondary | ICD-10-CM

## 2023-05-02 DIAGNOSIS — C801 Malignant (primary) neoplasm, unspecified: Secondary | ICD-10-CM | POA: Diagnosis not present

## 2023-05-02 DIAGNOSIS — C787 Secondary malignant neoplasm of liver and intrahepatic bile duct: Secondary | ICD-10-CM | POA: Diagnosis not present

## 2023-05-02 LAB — CBC
HCT: 28.5 % — ABNORMAL LOW (ref 36.0–46.0)
Hemoglobin: 9.3 g/dL — ABNORMAL LOW (ref 12.0–15.0)
MCH: 31.5 pg (ref 26.0–34.0)
MCHC: 32.6 g/dL (ref 30.0–36.0)
MCV: 96.6 fL (ref 80.0–100.0)
Platelets: 439 10*3/uL — ABNORMAL HIGH (ref 150–400)
RBC: 2.95 MIL/uL — ABNORMAL LOW (ref 3.87–5.11)
RDW: 19 % — ABNORMAL HIGH (ref 11.5–15.5)
WBC: 8.1 10*3/uL (ref 4.0–10.5)
nRBC: 0.2 % (ref 0.0–0.2)

## 2023-05-02 LAB — BASIC METABOLIC PANEL
Anion gap: 17 — ABNORMAL HIGH (ref 5–15)
BUN: 10 mg/dL (ref 8–23)
CO2: 22 mmol/L (ref 22–32)
Calcium: 8.9 mg/dL (ref 8.9–10.3)
Chloride: 99 mmol/L (ref 98–111)
Creatinine, Ser: 0.44 mg/dL (ref 0.44–1.00)
GFR, Estimated: >60 mL/min (ref >60)
Glucose, Bld: 86 mg/dL (ref 70–99)
Potassium: 3.7 mmol/L (ref 3.5–5.1)
Sodium: 138 mmol/L (ref 135–145)

## 2023-05-02 MED ORDER — AMOXICILLIN-POT CLAVULANATE 875-125 MG PO TABS
1.0000 | ORAL_TABLET | Freq: Two times a day (BID) | ORAL | Status: DC
  Administered 2023-05-02 – 2023-05-07 (×10): 1 via ORAL
  Filled 2023-05-02 (×11): qty 1

## 2023-05-02 MED ORDER — GADOBUTROL 1 MMOL/ML IV SOLN
8.0000 mL | Freq: Once | INTRAVENOUS | Status: AC | PRN
  Administered 2023-05-02: 8 mL via INTRAVENOUS

## 2023-05-02 NOTE — Progress Notes (Signed)
PROGRESS NOTE Jeanne Mcclure  XBM:[id number] DOB: 06/07/1951 DOA: 04/27/2023 PCP: Oneita Hurt, No  Brief Narrative/Hospital Course: 72 year old female with hypertension diabetes COPD/asthma,DJD, bipolar disorder recent ED visits in 10/19 with suicidal ideation cleared by psychiatry presented from home with complaint of right upper quadrant abdominal pain flank pain for several days and having dark-colored urine, patient has been dealing with intermittent right upper quadrant abdominal pain over the past year and had been persistent since past few weeks. MRCP showed>Severe hepatomegaly with infiltrative pattern of innumerable hypovascular lesions throughout the liver, presumably reflective of widespread metastatic disease. No definite primary source confidently identified on today's examination.  GI consulted advised treatment of diverticulitis and biopsy- s/p ultrasound-guided biopsy 10/30. It appears patient may not be interested in treatment and she may be interested to have further care at [facility]. She clearly mentioned that she is not interested in treatment and only wants to know the prognosis  Seen by oncology MRI brain obtained no acute finding.  Continue cancer associated pain treatment, constipation management and she will follow-up with outpatient oncology for further discussion.  Patient desires placement.  TOC was consulted   Subjective: Seen and examined this morning, some pain, did not sleep well due to MRI overnight.   No other new complaints initially refused to work with PT OT but has agreed since.    assessment and Plan: Principal Problem:   Sepsis (HCC) Active Problems:   Other constipation   Metastasis to liver of unknown origin (HCC)   Diffuse metastatic disease to the liver  Intrahepatic cholestasis: S/p MRCP and Biopsy-result pending.  Discussed with GI on admission.  Seen by oncology.  MRI brain without metastasis.  Plan is for outpatient follow-up for biopsy result and further  discussion. it seems patient is not interested in treatment  Acute diverticulitis at the hepatic flexure Severe sepsis secondary to above POA: Presented with tachycardia tachypnea and lactic acidosis and met severe sepsis criteria.  Tolerating diet at this time continue on Zosyn> will de-escalate to Augmentin. Discussed with Dr. Elnoria Howard he does not feel patient can tolerate colonoscopy at this time given acute diverticulitis  Normocytic anemia: hemoglobin at 10.3 g, monitor. Recent Labs  Lab 04/27/23 1739 04/28/23 0559 04/30/23 1203 05/01/23 0458 05/02/23 0507  HGB 11.6* 10.3* 9.1* 10.0* 9.3*  HCT 34.0* 30.8* 28.0* 31.3* 28.5*   HTN: BP stable. Cont low-dose metoprolol.    Diabetes mellitus diet controlled Hypoglycemia: Resolved.weaned off ivf. Cont po Recent Labs  Lab 04/28/23 2230 04/29/23 0342 04/29/23 0736 04/29/23 1640 05/01/23 0807  GLUCAP 80 74 69* 82 83    Bipolar disorder: Mood is stable.  Not on meds.  Recent episode of suicidal ideation and was cleared by psychiatry.  DVT prophylaxis: SCDs Start: 04/27/23 2324 Code Status:   Code Status: Full Code Family Communication: plan of care discussed with patient at bedside. Patient status is: Inpatient because of sepsis, liver lesion Level of care: Telemetry   Dispo: The patient is from: HOME            Anticipated disposition: SNF once bed available.    Objective: Vitals last 24 hrs: Vitals:   05/01/23 0500 05/01/23 0611 05/01/23 2112 05/02/23 0500  BP:  125/69  127/69  Pulse:  (!) 101 (!) 113 (!) 103  Resp:  15  16  Temp:  98.3 F (36.8 C)  97.6 F (36.4 C)  TempSrc:  Oral  Oral  SpO2:  96%  96%  Weight: 79.9 kg     Height:  Weight change:   Physical Examination: General exam: alert awake, oriented at baseline, older than stated age HEENT:Oral mucosa moist, Ear/Nose WNL grossly Respiratory system: Bilaterally clear BS,no use of accessory muscle Cardiovascular system: S1 & S2 +, No  JVD. Gastrointestinal system: Abdomen soft,NT,ND, BS+ Nervous System: Alert, awake, moving all extremities,and following commands. Extremities: LE edema neg,distal peripheral pulses palpable and warm.  Skin: No rashes,no icterus. MSK: Normal muscle bulk,tone, power   Medications reviewed: Scheduled Meds:  feeding supplement  1 Container Oral TID BM   gabapentin  200 mg Oral QHS   insulin aspart  0-6 Units Subcutaneous TID WC   metoprolol tartrate  12.5 mg Oral BID   montelukast  10 mg Oral QHS   polyethylene glycol  17 g Oral Daily   sodium chloride flush  3 mL Intravenous Q12H  Continuous Infusions:  piperacillin-tazobactam (ZOSYN)  IV 3.375 g (05/02/23 0618)    Diet Order             Diet regular Room service appropriate? Yes; Fluid consistency: Thin  Diet effective now                  Intake/Output Summary (Last 24 hours) at 05/02/2023 1329 Last data filed at 05/02/2023 0920 Gross per 24 hour  Intake 480 ml  Output --  Net 480 ml   Net IO Since Admission: 4,393.07 mL [05/02/23 1329]  Wt Readings from Last 3 Encounters:  05/01/23 79.9 kg  04/19/23 72.6 kg     Unresulted Labs (From admission, onward)     Start     Ordered   05/02/23 0500  AFP tumor marker  Tomorrow morning,   R       Question:  Specimen collection method  Answer:  Lab=Lab collect   05/01/23 1225   05/02/23 0500  Cancer antigen 19-9  Tomorrow morning,   R       Question:  Specimen collection method  Answer:  Lab=Lab collect   05/01/23 1225   05/02/23 0500  CEA  Tomorrow morning,   R       Question:  Specimen collection method  Answer:  Lab=Lab collect   05/01/23 1225   04/30/23 0500  Basic metabolic panel  Daily,   R     Question:  Specimen collection method  Answer:  Lab=Lab collect   04/29/23 0955   04/30/23 0500  CBC  Daily,   R     Question:  Specimen collection method  Answer:  Lab=Lab collect   04/29/23 0955          Data Reviewed: I have personally reviewed following labs and  imaging studies CBC: Recent Labs  Lab 04/27/23 1719 04/27/23 1739 04/28/23 0559 04/30/23 1203 05/01/23 0458 05/02/23 0507  WBC 8.3  --  8.5 6.9 7.7 8.1  NEUTROABS 6.5  --   --   --   --   --   HGB 10.8* 11.6* 10.3* 9.1* 10.0* 9.3*  HCT 32.3* 34.0* 30.8* 28.0* 31.3* 28.5*  MCV 95.3  --  96.9 98.6 98.4 96.6  PLT 452*  --  439* 426* 426* 439*   Basic Metabolic Panel: Recent Labs  Lab 04/27/23 1719 04/27/23 1739 04/28/23 0559 04/29/23 1259 04/30/23 1203 05/01/23 0458 05/02/23 0507  NA 136   < > 136 136 136 135 138  K 3.3*   < > 4.0 3.3* 3.6 3.9 3.7  CL 98   < > 100 98 102 102 99  CO2 23  --  23 25 23  21* 22  GLUCOSE 83   < > 65* 85 77 77 86  BUN 10   < > 8 7* 6* 10 10  CREATININE 0.45   < > 0.55 0.47 0.50 0.51 0.44  CALCIUM 9.3  --  9.1 8.9 8.3* 8.7* 8.9  MG 1.9  --  1.7  --   --   --   --   PHOS  --   --  3.4  --   --   --   --    < > = values in this interval not displayed.  GFR: Estimated Creatinine Clearance: 67.4 mL/min (by C-G formula based on SCr of 0.44 mg/dL). Liver Function Tests: Recent Labs  Lab 04/27/23 1719 04/28/23 0559  AST 152* 153*  ALT 57* 53*  ALKPHOS 278* 237*  BILITOT 3.6* 4.1*  PROT 6.6 6.2*  ALBUMIN 3.0* 2.8*   Recent Labs  Lab 04/27/23 1719  LIPASE 21  No results for input(s): "AMMONIA" in the last 168 hours. Coagulation Profile: Recent Labs  Lab 04/28/23 0559  INR 1.2  Sepsis Labs: Recent Labs  Lab 04/27/23 1852 04/27/23 2036 04/28/23 0039 04/28/23 0559  LATICACIDVEN 3.2* 3.4* 3.1* 2.9*    No results found for this or any previous visit (from the past 240 hour(s)).  Antimicrobials: Anti-infectives (From admission, onward)    Start     Dose/Rate Route Frequency Ordered Stop   04/28/23 0600  piperacillin-tazobactam (ZOSYN) IVPB 3.375 g        3.375 g 12.5 mL/hr over 240 Minutes Intravenous Every 8 hours 04/27/23 2332     04/27/23 2245  piperacillin-tazobactam (ZOSYN) IVPB 3.375 g        3.375 g 100 mL/hr over 30  Minutes Intravenous  Once 04/27/23 2230 04/28/23 0019     Culture/Microbiology No results found for: "SDES", "SPECREQUEST", "CULT", "REPTSTATUS"  Radiology Studies: MR BRAIN W WO CONTRAST  Result Date: 05/02/2023 CLINICAL DATA:  Initial metastatic disease evaluation, headache. EXAM: MRI HEAD WITHOUT AND WITH CONTRAST TECHNIQUE: Multiplanar, multiecho pulse sequences of the brain and surrounding structures were obtained without and with intravenous contrast. CONTRAST:  8mL GADAVIST GADOBUTROL 1 MMOL/ML IV SOLN COMPARISON:  None Available. FINDINGS: Brain: Mild age-related cerebral atrophy. Patchy and confluent T2/FLAIR hyperintensity involving the periventricular deep white matter both cerebral hemispheres, consistent with chronic small vessel ischemic disease, mild for age. No evidence for acute or subacute ischemia. Gray-white matter differentiation maintained. No areas of chronic cortical infarction. No acute or chronic intracranial blood products. No mass lesion or evidence for intracranial metastatic disease. No mass effect or midline shift. No hydrocephalus or extra-axial fluid collection. Empty sella noted. No abnormal enhancement. Vascular: Major intracranial vascular flow voids are maintained. Skull and upper cervical spine: Craniocervical junction within normal limits. Visualized bone marrow signal intensity diffusely heterogeneous without visible focal marrow replacing lesion. No scalp soft tissue abnormality. Sinuses/Orbits: Globes and orbital soft tissues within normal limits. Mild scattered mucosal thickening noted about the ethmoidal air cells. Paranasal sinuses are otherwise clear. No significant mastoid effusion. Other: None. IMPRESSION: 1. No acute intracranial abnormality. No evidence for intracranial metastatic disease. 2. Mild age-related cerebral atrophy with chronic small vessel ischemic disease. Electronically Signed   By: Rise Mu M.D.   On: 05/02/2023 02:18     LOS: 5  days   Lanae Boast, MD [facility]  05/02/2023, 1:29 PM

## 2023-05-02 NOTE — Progress Notes (Signed)
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  Continue conservative approach   Cancer associated pain She is currently taking oxycodone with reasonable pain control Continue the same   Chronic constipation Will add aggressive laxative therapy   Possible delusional paranoia The patient is currently not psychotic but might need close monitoring and follow-up upon discharge   Goals of care discussion The patient has expressed desire not to pursue systemic treatment  She wants me to hold off calling her daughter  Palliative team has been consulted We discussed prognosis with or without treatment  Discharge planning Unknown but I will defer to primary service I will sign off unless the patient changed her mind about treatment She has my number to call  All questions were answered. The patient knows to call the clinic with any problems, questions or concerns.   The total time spent in the appointment was 40 minutes encounter with patients including review of chart and various tests results, discussions about plan of care and coordination of care plan  Artis Delay, MD 05/02/2023 2:30 PM  Subjective:  Patient is seen.  We discussed test results.  Her pain is well-controlled.  Again, the patient expressed desire not to pursue treatment at this point  Objective:  Vitals:   05/01/23 2112 05/02/23 0500  BP:  127/69  Pulse: (!) 113 (!) 103  Resp:  16  Temp:  97.6 F (36.4 C)  SpO2:  96%     Intake/Output Summary (Last 24 hours) at 05/02/2023 1430 Last data filed at 05/02/2023 0920 Gross per 24  hour  Intake 480 ml  Output --  Net 480 ml    GENERAL:alert, no distress and comfortable    Labs:  Recent Labs    04/19/23 1151 04/27/23 1719 04/27/23 1739 04/28/23 0559 04/29/23 1259 04/30/23 1203 05/01/23 0458 05/02/23 0507  NA 138 136   < > 136   < > 136 135 138  K 3.6 3.3*   < > 4.0   < > 3.6 3.9 3.7  CL 100 98   < > 100   < > 102 102 99  CO2 22 23  --  23   < > 23 21* 22  GLUCOSE 92 83   < > 65*   < > 77 77 86  BUN 9 10   < > 8   < > 6* 10 10  CREATININE 0.68 0.45   < > 0.55   < > 0.50 0.51 0.44  CALCIUM 9.4 9.3  --  9.1   < > 8.3* 8.7* 8.9  GFRNONAA >60 >60  --  >60   < > >60 >60 >60  PROT 6.7 6.6  --  6.2*  --   --   --   --   ALBUMIN 3.1* 3.0*  --  2.8*  --   --   --   --   AST 132* 152*  --  153*  --   --   --   --   ALT 56* 57*  --  53*  --   --   --   --  ALKPHOS 300* 278*  --  237*  --   --   --   --   BILITOT 1.4* 3.6*  --  4.1*  --   --   --   --   BILIDIR  --  1.9*  --  2.3*  --   --   --   --   IBILI  --  1.7*  --  1.8*  --   --   --   --    < > = values in this interval not displayed.    Studies: I have personally reviewed her MRI brain MR BRAIN W WO CONTRAST  Result Date: 05/02/2023 CLINICAL DATA:  Initial metastatic disease evaluation, headache. EXAM: MRI HEAD WITHOUT AND WITH CONTRAST TECHNIQUE: Multiplanar, multiecho pulse sequences of the brain and surrounding structures were obtained without and with intravenous contrast. CONTRAST:  8mL GADAVIST GADOBUTROL 1 MMOL/ML IV SOLN COMPARISON:  None Available. FINDINGS: Brain: Mild age-related cerebral atrophy. Patchy and confluent T2/FLAIR hyperintensity involving the periventricular deep white matter both cerebral hemispheres, consistent with chronic small vessel ischemic disease, mild for age. No evidence for acute or subacute ischemia. Gray-white matter differentiation maintained. No areas of chronic cortical infarction. No acute or chronic intracranial blood products. No mass lesion or evidence for  intracranial metastatic disease. No mass effect or midline shift. No hydrocephalus or extra-axial fluid collection. Empty sella noted. No abnormal enhancement. Vascular: Major intracranial vascular flow voids are maintained. Skull and upper cervical spine: Craniocervical junction within normal limits. Visualized bone marrow signal intensity diffusely heterogeneous without visible focal marrow replacing lesion. No scalp soft tissue abnormality. Sinuses/Orbits: Globes and orbital soft tissues within normal limits. Mild scattered mucosal thickening noted about the ethmoidal air cells. Paranasal sinuses are otherwise clear. No significant mastoid effusion. Other: None. IMPRESSION: 1. No acute intracranial abnormality. No evidence for intracranial metastatic disease. 2. Mild age-related cerebral atrophy with chronic small vessel ischemic disease. Electronically Signed   By: Rise Mu M.D.   On: 05/02/2023 02:18   US BIOPSY (LIVER)  Result Date: 04/30/2023 CLINICAL DATA:  Weight loss, right upper abdominal pain, flank pain, multiple liver lesions EXAM: ULTRASOUND-GUIDED CORE LIVER BIOPSY TECHNIQUE: An ultrasound guided liver biopsy was thoroughly discussed with the patient and questions were answered. The benefits, risks, alternatives, and complications were also discussed. The patient understands and wishes to proceed with the procedure. A verbal as well as written consent was obtained. Survey ultrasound of the liver was performed, representative lesion localized, and an appropriate skin entry site was determined. Skin site was marked, prepped with chlorhexidine, and draped in usual sterile fashion, and infiltrated locally with 1% lidocaine. Intravenous Fentanyl and Versed 2mg  were administered as conscious sedation during continuous monitoring of the patient's level of consciousness and physiological / cardiorespiratory status by the radiology RN, with a total moderate sedation time of 10 minutes.  A 17 gauge trocar needle was advanced under ultrasound guidance into the liver to the margin of the lesion. 2 solid-appearing 3 cm coaxial 18gauge core samples were then obtained through the guide needle. The guide needle was removed. Post procedure scans demonstrate no apparent complication. COMPLICATIONS: COMPLICATIONS None immediate FINDINGS: Multiple liver lesions identified. Representative right lobe lesion was approach for core biopsy sampling as above. IMPRESSION: 1. Technically successful ultrasound guided core liver lesion biopsy. Electronically Signed   By: Corlis Leak M.D.   On: 04/30/2023 15:44   MR ABDOMEN MRCP W WO CONTAST  Result Date: 04/28/2023 CLINICAL DATA:  72 year old  female with history of right upper quadrant abdominal pain. Possible metastatic disease to the liver noted on prior CT examination. Follow-up study. EXAM: MRI ABDOMEN WITHOUT AND WITH CONTRAST (INCLUDING MRCP) TECHNIQUE: Multiplanar multisequence MR imaging of the abdomen was performed both before and after the administration of intravenous contrast. Heavily T2-weighted images of the biliary and pancreatic ducts were obtained, and three-dimensional MRCP images were rendered by post processing. CONTRAST:  7mL GADAVIST GADOBUTROL 1 MMOL/ML IV SOLN COMPARISON:  No prior abdominal MRI. CT of the chest, abdomen and pelvis 04/27/2023. FINDINGS: Lower chest: Small right pleural effusion lying dependently. Hepatobiliary: Scattered throughout the liver there are innumerable predominantly T1 hypointense, heterogeneous T2 signal intensity (centrally T2 hypointense with variable degrees of internal and peripheral T2 hyperintensity), and generally hypovascular lesions scattered throughout the hepatic parenchyma, all of which restrict diffusion, highly concerning for diffuse metastatic disease to the liver. Among the largest lesions is a dominant lesion in the superior aspect of the left lobe of the liver centered predominantly in segment 4A  (axial image 28 of series 19) measuring 6.1 x 5.0 cm. The liver is diffusely enlarged measuring 25.5 cm in craniocaudal span and has a very nodular contour, likely reflective of metastatic disease (although underlying cirrhosis is not excluded). No intra or extrahepatic biliary ductal dilatation is noted. Gallbladder is unremarkable in appearance. Pancreas: No pancreatic mass. No pancreatic ductal dilatation. No pancreatic or peripancreatic fluid collections or inflammatory changes. Spleen:  Unremarkable. Adrenals/Urinary Tract: Bilateral kidneys and adrenal glands are normal in appearance. No hydroureteronephrosis in the visualized portions of the abdomen. Stomach/Bowel: Visualized portions are unremarkable. Vascular/Lymphatic: No aneurysm identified in the visualized abdominal vasculature. No lymphadenopathy noted in the abdomen. Other:  Trace volume of perihepatic ascites. Musculoskeletal: No aggressive appearing osseous lesions are noted in the visualized portions of the skeleton. IMPRESSION: 1. Severe hepatomegaly with infiltrative pattern of innumerable hypovascular lesions throughout the liver, presumably reflective of widespread metastatic disease. No definite primary source confidently identified on today's examination. 2. Small right pleural effusion lying dependently. Electronically Signed   By: Trudie Reed M.D.   On: 04/28/2023 15:31   CT CHEST ABDOMEN PELVIS WO CONTRAST  Result Date: 04/27/2023 CLINICAL DATA:  Unintended weight loss, right upper quadrant pain and flank pain for several days. Dark colored urine. EXAM: CT CHEST, ABDOMEN AND PELVIS WITHOUT CONTRAST TECHNIQUE: Multidetector CT imaging of the chest, abdomen and pelvis was performed following the standard protocol without IV contrast. RADIATION DOSE REDUCTION: This exam was performed according to the departmental dose-optimization program which includes automated exposure control, adjustment of the mA and/or kV according to patient  size and/or use of iterative reconstruction technique. COMPARISON:  Same day ultrasound FINDINGS: CT CHEST FINDINGS Cardiovascular: No pericardial effusion. Coronary artery and aortic atherosclerotic calcification. Normal caliber thoracic aorta. Mediastinum/Nodes: Trachea and esophagus are unremarkable. No thoracic adenopathy noting decreased sensitivity on noncontrast exam. Lungs/Pleura: Bibasilar atelectasis/scarring. Trace right pleural effusion. No pneumothorax. Musculoskeletal: No acute fracture or destructive osseous lesion. CT ABDOMEN PELVIS FINDINGS Hepatobiliary: The liver is enlarged and contains multiple poorly defined hypoattenuating lesions. These are incompletely evaluated without IV contrast. Small amount of subcapsular fluid about the anterior right hepatic lobe. Contracted gallbladder. No biliary dilation. Pancreas: Unremarkable. No pancreatic ductal dilatation or surrounding inflammatory changes. Spleen: Unremarkable. Adrenals/Urinary Tract: Normal adrenal glands and kidneys. Unremarkable bladder. Stomach/Bowel: Normal caliber large and small bowel. Stranding and fluid about a diverticulum at the hepatic flexure (circa series 8/image 50.) normal appendix. Stomach is within normal limits. Vascular/Lymphatic: No  evidence of lymphadenopathy on noncontrast exam. Aortic atherosclerotic calcification. Reproductive: Calcified uterine fibroids. Prominent ovaries are normal assessed without contrast. Other: Small volume perihepatic and pelvic free fluid. No free intraperitoneal air. Musculoskeletal: No acute fracture or destructive osseous lesion. Age related spondylosis. Grade 1 anterolisthesis of L4. No destructive osseous lesion. Degenerative changes at the pubic symphysis. IMPRESSION: 1. Hepatomegaly with multiple poorly defined hypoattenuating lesions. These are incompletely evaluated without IV contrast but are concerning for malignancy. Recommend further evaluation with liver protocol MRI. 2. Acute  uncomplicated diverticulitis at the hepatic flexure. 3. Small volume perihepatic and pelvic free fluid. Aortic Atherosclerosis (ICD10-I70.0). Electronically Signed   By: Minerva Fester M.D.   On: 04/27/2023 22:11   US Abdomen Limited RUQ (LIVER/GB)  Result Date: 04/27/2023 CLINICAL DATA:  Right upper quadrant pain for 2 weeks EXAM: ULTRASOUND ABDOMEN LIMITED RIGHT UPPER QUADRANT COMPARISON:  None Available. FINDINGS: Gallbladder: Gallbladder is contracted, with nonspecific gallbladder wall thickening measuring up to 5 mm. No evidence of cholelithiasis. Negative sonographic Murphy sign. Common bile duct: Diameter: 2 mm Liver: Evaluation of the liver parenchyma is limited due to respiratory motion. Liver is enlarged and heterogeneous, with multiple heterogeneous hyperechoic masslike areas identified. Largest in the right lobe measures 5.4 cm, and these masslike areas displaces the hepatic vasculature and do not demonstrate any significant increased Doppler flow. Dedicated liver CT is recommended for further evaluation. Portal vein is patent on color Doppler imaging with normal direction of blood flow towards the liver. Other: None. IMPRESSION: 1. Gallbladder is contracted, limiting its evaluation. Gallbladder wall thickening measuring up to 5 mm is a nonspecific finding, with no other sonographic evidence of cholelithiasis or cholecystitis. 2. Diffuse heterogeneity throughout the liver parenchyma, with hyperechoic masslike areas as above. Sonographic appearance is nonspecific, and dedicated liver CT with without IV contrast is recommended. Electronically Signed   By: Sharlet Salina M.D.   On: 04/27/2023 17:51

## 2023-05-02 NOTE — TOC Progression Note (Signed)
Transition of Care [facility]) - Progression Note    Patient Details  Name: Jeanne Mcclure MRN: [id number] Date of Birth: Feb 06, 1951  Transition of Care [facility]) CM/SW Contact  Lynnell Fiumara, Olegario Messier, RN Phone Number: 05/02/2023, 2:52 PM  Clinical Narrative:  Awaiting level 2 pasrr case assigned. Bed offers given await choice.     Expected Discharge Plan: Skilled Nursing Facility Barriers to Discharge: Awaiting State Approval [facility])  Expected Discharge Plan and Services   Discharge Planning Services: CM Consult Post Acute Care Choice: Resumption of Svcs/PTA Provider Living arrangements for the past 2 months: Single Family Home                                       Social Determinants of Health (SDOH) Interventions SDOH Screenings   Food Insecurity: No Food Insecurity (04/28/2023)  Housing: Low Risk  (04/28/2023)  Transportation Needs: No Transportation Needs (04/28/2023)  Utilities: Not At Risk (04/28/2023)  Tobacco Use: High Risk (04/30/2023)    Readmission Risk Interventions     No data to display

## 2023-05-02 NOTE — Evaluation (Signed)
Occupational Therapy Evaluation Patient Details Name: Jeanne Mcclure MRN: [id number] DOB: 1950-10-27 Today's Date: 05/02/2023   History of Present Illness 72 y.o. female with hx hypertension, diabetes, COPD, degenerative arthritis, bipolar disorder, recent ED visit 10/19 with suicidal ideation ultimately cleared by psychiatry, who presents due to persistent right upper quadrant pain.  Found to have multiple hypodense liver lesions, suspected metastases with unknown primary.  Septic likely from intrahepatic cholestasis.   Clinical Impression   Pt was seen for OT evaluation this date. Prior to hospital admission, pt was IND, driving, and mobility without AD. Pt recently moved to assist daughter, who has cancer. She is renting a room in a second story apartment but tells this Clinical research associate that she is unable to return there and is considering ALF after potentially going to short-term rehab to address current impairments.   Pt presents to acute OT demonstrating impaired ADL performance and functional mobility 2/2 decreased tolerance to activity and mild balance deficits (See OT problem list for additional functional deficits). Pt completes functional mobility t/f bathroom without AD (uses IV pole for unilateral support), toilet transfer with CGA, supervision for pericare, stands at sink to wash hands. HR 120-135. Pt would benefit from skilled OT services to address noted impairments and functional limitations (see below for any additional details) in order to maximize safety and independence while minimizing falls risk and caregiver burden. Anticipate the need for follow up OT services upon acute hospital DC.  Patient will benefit from continued inpatient follow up therapy, <3 hours/day       If plan is discharge home, recommend the following: A little help with walking and/or transfers;A little help with bathing/dressing/bathroom;Assist for transportation;Help with stairs or ramp for entrance    Functional  Status Assessment  Patient has had a recent decline in their functional status and demonstrates the ability to make significant improvements in function in a reasonable and predictable amount of time.  Equipment Recommendations  None recommended by OT (defer)    Recommendations for Other Services Other (comment)     Precautions / Restrictions Precautions Precautions: Fall Restrictions Weight Bearing Restrictions: No      Mobility Bed Mobility Overal bed mobility: Needs Assistance Bed Mobility: Supine to Sit, Sit to Supine     Supine to sit: Supervision Sit to supine: Supervision   General bed mobility comments: HOB up, used rail    Transfers Overall transfer level: Needs assistance Equipment used: None Transfers: Sit to/from Stand Sit to Stand: Contact guard assist                  Balance Overall balance assessment: Mild deficits observed, not formally tested, Needs assistance Sitting-balance support: Feet supported Sitting balance-Leahy Scale: Good Sitting balance - Comments: steady sitting EOB   Standing balance support: Single extremity supported, During functional activity                               ADL either performed or assessed with clinical judgement   ADL Overall ADL's : Needs assistance/impaired     Grooming: Wash/dry hands;Wash/dry face;Standing;Set up                   Toilet Transfer: Contact guard assist;Ambulation;Regular Toilet   Toileting- Clothing Manipulation and Hygiene: Supervision/safety;Sit to/from stand       Functional mobility during ADLs: Contact guard assist General ADL Comments: Pt completes functional mobility t/f bathroom without AD (uses IV pole for unilateral  support), toilet transfer with CGA, supervision for pericare, stands at sink to wash hands.      Pertinent Vitals/Pain Pain Assessment Pain Assessment: 0-10 Pain Score: 4  Pain Location: RUQ Pain Descriptors / Indicators: Sore,  Cramping Pain Intervention(s): Limited activity within patient's tolerance, Monitored during session     Extremity/Trunk Assessment Upper Extremity Assessment Upper Extremity Assessment: Overall WFL for tasks assessed   Lower Extremity Assessment Lower Extremity Assessment: Overall WFL for tasks assessed       Communication Communication Communication: No apparent difficulties   Cognition Arousal: Alert Behavior During Therapy: Flat affect Overall Cognitive Status: Within Functional Limits for tasks assessed                                                  Home Living Family/patient expects to be discharged to:: Unsure                                 Additional Comments: moved here from [place] in June to help her daughter who has cancer, rents a second floor room so has flight of stairs to get to her room; reports she has no assistance available locally (daughter is sick with cancer)      Prior Functioning/Environment Prior Level of Function : Independent/Modified Independent;Driving             Mobility Comments: walks without AD, denies falls in past 6 months ADLs Comments: independent, reports she could drive and get groceries, when pain got worse last week she had groceries delivered        OT Problem List: Decreased strength;Decreased activity tolerance;Impaired balance (sitting and/or standing);Pain      OT Treatment/Interventions: Self-care/ADL training;Therapeutic exercise;Energy conservation;DME and/or AE instruction;Balance training;Patient/family education;Therapeutic activities    OT Goals(Current goals can be found in the care plan section) Acute Rehab OT Goals Patient Stated Goal: to go to rehab OT Goal Formulation: With patient Time For Goal Achievement: 05/16/23 Potential to Achieve Goals: Good  OT Frequency: Min 1X/week       AM-PAC OT "6 Clicks" Daily Activity     Outcome Measure Help from another person  eating meals?: None Help from another person taking care of personal grooming?: None Help from another person toileting, which includes using toliet, bedpan, or urinal?: A Little Help from another person bathing (including washing, rinsing, drying)?: A Little Help from another person to put on and taking off regular upper body clothing?: None Help from another person to put on and taking off regular lower body clothing?: A Little 6 Click Score: 21   End of Session Nurse Communication: Mobility status  Activity Tolerance: Patient tolerated treatment well;No increased pain Patient left: in bed;with call bell/phone within reach;with bed alarm set  OT Visit Diagnosis: Other abnormalities of gait and mobility (R26.89);Pain;Unsteadiness on feet (R26.81) Pain - Right/Left: Right                Time: 0981-1914 OT Time Calculation (min): 31 min Charges:  OT General Charges $OT Visit: 1 Visit OT Evaluation $OT Eval Low Complexity: 1 Low OT Treatments $Self Care/Home Management : 8-22 mins  Celso Granja L. Librado Guandique, OTR/L  05/02/23, 11:26 AM

## 2023-05-02 NOTE — Progress Notes (Signed)
   Advanced Care Planning Note   Patient Name: Jeanne Mcclure       Date: 05/02/2023 DOB: February 27, 1951  Age: 72 y.o. MRN#: [id number] Attending Physician: Lanae Boast, MD Primary Care Physician: Pcp, No Admit Date: 04/27/2023 Length of Stay: 5 days  Advanced Care Planning Details:   Persons Present: ACP Persons Present: Patient  Provider(s) Present: Palliative Medicine Provider Wynne Dust, NP  All persons present were open and agreeable to conversation about advanced care planning. Education offered on the importance of documentation and the logistics of securing advanced directives.   We had in-depth discussion on current clinical status, prognosis, options available.  Education was offered on the following:   Medical treatment options available, Possible risks and benefits of each option/intervention, Available ACP documents able to be completed, and Importance/Benefit of completing ACP documents  Forms completed include:  DNR ([place]) These forms can be found in the ACP link in the area under the patient's picture  After conversation, patient and/or family determined the desired discharge disposition at this time would be: To Be Determined   Summary of ACP Decisions:   Changed to DNR-Limited Allow time for thought, prayer, family discussion to decide if she wants to proceed with treatment or not Follow-up on goals of care when treatment decision made     Thank you for allowing [place] to participate in the care of Jeanne Mcclure PMT will continue to support holistically.  Total ACP Time: 20 min  The time above is specific to time spend on advanced care planning. It does not include any separately billed services.  Signed by: Wynne Dust, DNP, AGNP-C Palliative Medicine Team  Team Phone # [phone] (Nights/Weekends)  05/02/2023, 4:12 PM
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
## 2023-05-02 NOTE — Progress Notes (Signed)
OT Cancellation Note  Patient Details Name: Jeanne Mcclure MRN: [id number] DOB: June 12, 1951   Cancelled Treatment:    Reason Eval/Treat Not Completed: Fatigue/lethargy limiting ability to participate. OT orders for imminent discharge received, chart reviewed. Pt received in bed, eyes closed, barely opens eyes to speak with therapist. Pt refusing OOB mobility and OT eval at this time, stating pain and fatigue (recently had pain meds). OT attempts to educate pt on importance of therapy participation for rehab recommendations with pt continuing to refuse. Sent MD and TOC update via secure chat.   Maigen Mozingo L. Jahari Billy, OTR/L  05/02/23, 8:44 AM

## 2023-05-03 DIAGNOSIS — Z66 Do not resuscitate: Secondary | ICD-10-CM | POA: Diagnosis not present

## 2023-05-03 DIAGNOSIS — Z515 Encounter for palliative care: Secondary | ICD-10-CM | POA: Diagnosis not present

## 2023-05-03 DIAGNOSIS — Z7189 Other specified counseling: Secondary | ICD-10-CM | POA: Diagnosis not present

## 2023-05-03 DIAGNOSIS — A419 Sepsis, unspecified organism: Secondary | ICD-10-CM | POA: Diagnosis not present

## 2023-05-03 LAB — CBC
HCT: 29.8 % — ABNORMAL LOW (ref 36.0–46.0)
Hemoglobin: 9.6 g/dL — ABNORMAL LOW (ref 12.0–15.0)
MCH: 32 pg (ref 26.0–34.0)
MCHC: 32.2 g/dL (ref 30.0–36.0)
MCV: 99.3 fL (ref 80.0–100.0)
Platelets: 462 10*3/uL — ABNORMAL HIGH (ref 150–400)
RBC: 3 MIL/uL — ABNORMAL LOW (ref 3.87–5.11)
RDW: 19.3 % — ABNORMAL HIGH (ref 11.5–15.5)
WBC: 8.7 10*3/uL (ref 4.0–10.5)
nRBC: 0.2 % (ref 0.0–0.2)

## 2023-05-03 LAB — BASIC METABOLIC PANEL
Anion gap: 15 (ref 5–15)
BUN: 9 mg/dL (ref 8–23)
CO2: 21 mmol/L — ABNORMAL LOW (ref 22–32)
Calcium: 8.8 mg/dL — ABNORMAL LOW (ref 8.9–10.3)
Chloride: 97 mmol/L — ABNORMAL LOW (ref 98–111)
Creatinine, Ser: 0.46 mg/dL (ref 0.44–1.00)
GFR, Estimated: >60 mL/min (ref >60)
Glucose, Bld: 76 mg/dL (ref 70–99)
Potassium: 3.8 mmol/L (ref 3.5–5.1)
Sodium: 133 mmol/L — ABNORMAL LOW (ref 135–145)

## 2023-05-03 LAB — CEA: CEA: 245 ng/mL — ABNORMAL HIGH (ref 0.0–4.7)

## 2023-05-03 LAB — CANCER ANTIGEN 19-9: CA 19-9: <2 U/mL (ref 0–35)

## 2023-05-03 LAB — AFP TUMOR MARKER: AFP, Serum, Tumor Marker: 484 ng/mL — ABNORMAL HIGH (ref 0.0–9.2)

## 2023-05-03 MED ORDER — OXYCODONE HCL 5 MG PO TABS: 7.5000 mg | ORAL_TABLET | Freq: Four times a day (QID) | ORAL | Status: DC | PRN

## 2023-05-03 MED ORDER — OXYCODONE HCL 5 MG PO TABS
7.5000 mg | ORAL_TABLET | Freq: Four times a day (QID) | ORAL | Status: DC | PRN
  Administered 2023-05-03 – 2023-05-06 (×11): 7.5 mg via ORAL
  Filled 2023-05-03 (×11): qty 2

## 2023-05-03 MED ORDER — OXYCODONE HCL 7.5 MG PO TABS: 7.5000 mg | ORAL_TABLET | Freq: Four times a day (QID) | ORAL | 0 refills | Status: AC | PRN

## 2023-05-03 NOTE — TOC Progression Note (Signed)
Transition of Care [facility]) - Progression Note    Patient Details  Name: Jeanne Mcclure MRN: [id number] Date of Birth: [birth year]  Transition of Care [facility]) CM/SW Contact  Adrian Prows, RN Phone Number: 05/03/2023, 2:22 PM  Clinical Narrative:    Called pt's dtr Loletha Grayer [phone]) to obtain bed choice; unable to LVM; message says mailbox full; awaiting level 2 PASRR.   Expected Discharge Plan: Skilled Nursing Facility Barriers to Discharge: Awaiting State Approval [facility])  Expected Discharge Plan and Services   Discharge Planning Services: CM Consult Post Acute Care Choice: Resumption of Svcs/PTA Provider Living arrangements for the past 2 months: Single Family Home                                       Social Determinants of Health (SDOH) Interventions SDOH Screenings   Food Insecurity: No Food Insecurity (04/28/2023)  Housing: Low Risk  (04/28/2023)  Transportation Needs: No Transportation Needs (04/28/2023)  Utilities: Not At Risk (04/28/2023)  Tobacco Use: High Risk (04/30/2023)    Readmission Risk Interventions     No data to display

## 2023-05-03 NOTE — Plan of Care (Signed)
  Problem: Health Behavior/Discharge Planning: Goal: Ability to manage health-related needs will improve Outcome: Progressing   Problem: Coping: Goal: Level of anxiety will decrease Outcome: Progressing   Problem: Pain Management: Goal: General experience of comfort will improve Outcome: Progressing

## 2023-05-03 NOTE — Progress Notes (Signed)
PROGRESS NOTE Jeanne Mcclure  ZOX:[id number] DOB: Jun 03, 1951 DOA: 04/27/2023 PCP: Oneita Hurt, No  Brief Narrative/Hospital Course: 72 year old female with hypertension diabetes COPD/asthma,DJD, bipolar disorder recent ED visits in 10/19 with suicidal ideation cleared by psychiatry presented from home with complaint of right upper quadrant abdominal pain flank pain for several days and having dark-colored urine, patient has been dealing with intermittent right upper quadrant abdominal pain over the past year and had been persistent since past few weeks. MRCP showed>Severe hepatomegaly with infiltrative pattern of innumerable hypovascular lesions throughout the liver, presumably reflective of widespread metastatic disease. No definite primary source confidently identified on today's examination.  GI consulted advised treatment of diverticulitis and biopsy- s/p ultrasound-guided biopsy 10/30. It appears patient may not be interested in treatment and she may be interested to have further care at [facility]. She clearly mentioned that she is not interested in treatment and only wants to know the prognosis  Seen by oncology MRI brain obtained no acute finding.cea and afp level elevated. Continue cancer associated pain treatment, constipation management and she will follow-up with outpatient oncology for further discussion.  Patient desires placement.  TOC was consulted-and awaiting on placement.Palliative care was also consulted: Patient changed to DNR.  Continue outpatient follow-up   Subjective: Seen this am C/o pain on rt abdomen Asking if she can go to snf - awaiting snf placement-toc messaged  assessment and Plan: Principal Problem:   Sepsis (HCC) Active Problems:   Other constipation   Metastasis to liver of unknown origin (HCC)   Diffuse metastatic disease to the liver  Intrahepatic cholestasis Cancer associated pain: S/p MRCP and Biopsy-result pending. Discussed with GI on admission.  Seen by oncology.   MRI brain without metastasis.  Plan is for outpatient follow-up for biopsy result and further discussion.cea and afp level elevated.  it seems patient is not interested in treatment-daughter aware. Cont pain control upping oxy to 7.5mg . she was lost of fu for repeat colonscopy despite multiple attemepts by Dr Elnoria Howard in past.  Acute diverticulitis at the hepatic flexure Severe sepsis secondary to above POA: Presented with tachycardia tachypnea and lactic acidosis and met severe sepsis criteria.  Tolerating diet at this time continue on Zosyn> will de-escalated to Augmentin complete 10 day course . Discussed with Dr. Elnoria Howard he does not feel patient can tolerate colonoscopy at this time given acute diverticulitis.  Normocytic anemia: hemoglobin at 10.3 g, monitor. Recent Labs  Lab 04/28/23 0559 04/30/23 1203 05/01/23 0458 05/02/23 0507 05/03/23 0524  HGB 10.3* 9.1* 10.0* 9.3* 9.6*  HCT 30.8* 28.0* 31.3* 28.5* 29.8*   HTN: BP stable. Cont low-dose metoprolol. Stopped acei and lasix.    Diabetes mellitus diet controlled Hypoglycemia: Resolved.weaned off ivf. Cont po Recent Labs  Lab 04/28/23 2230 04/29/23 0342 04/29/23 0736 04/29/23 1640 05/01/23 0807  GLUCAP 80 74 69* 82 83    Bipolar disorder: Mood is stable. Cont gabapentin.Recent episode of suicidal ideation and was cleared by psychiatry. Aaox3. Concerned about the new diagnosis of possible cancer  DVT prophylaxis: SCDs Start: 04/27/23 2324 Code Status:   Code Status: Full Code Family Communication: plan of care discussed with patient at bedside. Patient status is: Inpatient because of sepsis, liver lesion Level of care: Telemetry   Dispo: The patient is from: HOME            Anticipated disposition: SNF once bed available.  Medically stable for discharge  Objective: Vitals last 24 hrs: Vitals:   05/02/23 2138 05/02/23 2138 05/03/23 0500 05/03/23 0530  BP: Marland Kitchen)  145/83 (!) 145/83  (!) 141/74  Pulse: 99 (!) 111  (!) 104   Resp:  18  18  Temp:  99 F (37.2 C)  98.9 F (37.2 C)  TempSrc:  Oral  Oral  SpO2:  100%  97%  Weight:   76.9 kg   Height:       Weight change:   Physical Examination: General exam: alert awake, oriented at baseline, older than stated age HEENT:Oral mucosa moist, Ear/Nose WNL grossly Respiratory system: Bilaterally clear BS,no use of accessory muscle Cardiovascular system: S1 & S2 +, No JVD. Gastrointestinal system: Abdomen soft,Tender rt abdomen,ND, BS+ Nervous System: Alert, awake, moving all extremities,and following commands. Extremities: LE edema neg,distal peripheral pulses palpable and warm.  Skin: No rashes,no icterus. MSK: Normal muscle bulk,tone, power   Medications reviewed: Scheduled Meds:  amoxicillin-clavulanate  1 tablet Oral Q12H   feeding supplement  1 Container Oral TID BM   gabapentin  200 mg Oral QHS   insulin aspart  0-6 Units Subcutaneous TID WC   metoprolol tartrate  12.5 mg Oral BID   montelukast  10 mg Oral QHS   polyethylene glycol  17 g Oral Daily   sodium chloride flush  3 mL Intravenous Q12H  Continuous Infusions:    Diet Order             Diet regular Room service appropriate? Yes; Fluid consistency: Thin  Diet effective now                  Intake/Output Summary (Last 24 hours) at 05/03/2023 1005 Last data filed at 05/03/2023 0939 Gross per 24 hour  Intake 480 ml  Output --  Net 480 ml   Net IO Since Admission: 4,873.07 mL [05/03/23 1005]  Wt Readings from Last 3 Encounters:  05/03/23 76.9 kg  04/19/23 72.6 kg     Unresulted Labs (From admission, onward)    None     Data Reviewed: I have personally reviewed following labs and imaging studies CBC: Recent Labs  Lab 04/27/23 1719 04/27/23 1739 04/28/23 0559 04/30/23 1203 05/01/23 0458 05/02/23 0507 05/03/23 0524  WBC 8.3  --  8.5 6.9 7.7 8.1 8.7  NEUTROABS 6.5  --   --   --   --   --   --   HGB 10.8*   < > 10.3* 9.1* 10.0* 9.3* 9.6*  HCT 32.3*   < > 30.8* 28.0*  31.3* 28.5* 29.8*  MCV 95.3  --  96.9 98.6 98.4 96.6 99.3  PLT 452*  --  439* 426* 426* 439* 462*   < > = values in this interval not displayed.   Basic Metabolic Panel: Recent Labs  Lab 04/27/23 1719 04/27/23 1739 04/28/23 0559 04/29/23 1259 04/30/23 1203 05/01/23 0458 05/02/23 0507 05/03/23 0524  NA 136   < > 136 136 136 135 138 133*  K 3.3*   < > 4.0 3.3* 3.6 3.9 3.7 3.8  CL 98   < > 100 98 102 102 99 97*  CO2 23  --  23 25 23  21* 22 21*  GLUCOSE 83   < > 65* 85 77 77 86 76  BUN 10   < > 8 7* 6* 10 10 9   CREATININE 0.45   < > 0.55 0.47 0.50 0.51 0.44 0.46  CALCIUM 9.3  --  9.1 8.9 8.3* 8.7* 8.9 8.8*  MG 1.9  --  1.7  --   --   --   --   --  PHOS  --   --  3.4  --   --   --   --   --    < > = values in this interval not displayed.  GFR: Estimated Creatinine Clearance: 66.2 mL/min (by C-G formula based on SCr of 0.46 mg/dL). Liver Function Tests: Recent Labs  Lab 04/27/23 1719 04/28/23 0559  AST 152* 153*  ALT 57* 53*  ALKPHOS 278* 237*  BILITOT 3.6* 4.1*  PROT 6.6 6.2*  ALBUMIN 3.0* 2.8*   Recent Labs  Lab 04/27/23 1719  LIPASE 21  No results for input(s): "AMMONIA" in the last 168 hours. Coagulation Profile: Recent Labs  Lab 04/28/23 0559  INR 1.2  Sepsis Labs: Recent Labs  Lab 04/27/23 1852 04/27/23 2036 04/28/23 0039 04/28/23 0559  LATICACIDVEN 3.2* 3.4* 3.1* 2.9*    No results found for this or any previous visit (from the past 240 hour(s)).  Antimicrobials: Anti-infectives (From admission, onward)    Start     Dose/Rate Route Frequency Ordered Stop   05/02/23 1430  amoxicillin-clavulanate (AUGMENTIN) 875-125 MG per tablet 1 tablet        1 tablet Oral Every 12 hours 05/02/23 1338     04/28/23 0600  piperacillin-tazobactam (ZOSYN) IVPB 3.375 g  Status:  Discontinued        3.375 g 12.5 mL/hr over 240 Minutes Intravenous Every 8 hours 04/27/23 2332 05/02/23 1338   04/27/23 2245  piperacillin-tazobactam (ZOSYN) IVPB 3.375 g        3.375  g 100 mL/hr over 30 Minutes Intravenous  Once 04/27/23 2230 04/28/23 0019     Culture/Microbiology No results found for: "SDES", "SPECREQUEST", "CULT", "REPTSTATUS"  Radiology Studies: MR BRAIN W WO CONTRAST  Result Date: 05/02/2023 CLINICAL DATA:  Initial metastatic disease evaluation, headache. EXAM: MRI HEAD WITHOUT AND WITH CONTRAST TECHNIQUE: Multiplanar, multiecho pulse sequences of the brain and surrounding structures were obtained without and with intravenous contrast. CONTRAST:  8mL GADAVIST GADOBUTROL 1 MMOL/ML IV SOLN COMPARISON:  None Available. FINDINGS: Brain: Mild age-related cerebral atrophy. Patchy and confluent T2/FLAIR hyperintensity involving the periventricular deep white matter both cerebral hemispheres, consistent with chronic small vessel ischemic disease, mild for age. No evidence for acute or subacute ischemia. Gray-white matter differentiation maintained. No areas of chronic cortical infarction. No acute or chronic intracranial blood products. No mass lesion or evidence for intracranial metastatic disease. No mass effect or midline shift. No hydrocephalus or extra-axial fluid collection. Empty sella noted. No abnormal enhancement. Vascular: Major intracranial vascular flow voids are maintained. Skull and upper cervical spine: Craniocervical junction within normal limits. Visualized bone marrow signal intensity diffusely heterogeneous without visible focal marrow replacing lesion. No scalp soft tissue abnormality. Sinuses/Orbits: Globes and orbital soft tissues within normal limits. Mild scattered mucosal thickening noted about the ethmoidal air cells. Paranasal sinuses are otherwise clear. No significant mastoid effusion. Other: None. IMPRESSION: 1. No acute intracranial abnormality. No evidence for intracranial metastatic disease. 2. Mild age-related cerebral atrophy with chronic small vessel ischemic disease. Electronically Signed   By: Rise Mu M.D.   On: 05/02/2023  02:18     LOS: 6 days   Lanae Boast, MD [facility]  05/03/2023, 10:05 AM

## 2023-05-03 NOTE — Progress Notes (Signed)
Daily Progress Note   Patient Name: Jeanne Mcclure       Date: 05/03/2023 DOB: 06-17-1951  Age: 72 y.o. MRN#: [id number] Attending Physician: Lanae Boast, MD Primary Care Physician: Pcp, No Admit Date: 04/27/2023 Length of Stay: 6 days  Reason for Consultation/Follow-up: Establishing goals of care  HPI/Patient Profile:  72 y.o. female  with past medical history of hypertension, diabetes, COPD/asthma, DJD,  bipolar disorder. 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    Per notes initially the patient decided she would not want to treatment.  Biopsy has been done and pending as are cancer labs.   Palliative medicine was consulted for GOC conversations.  Subjective:   Subjective: Chart Reviewed. Updates received. Patient Assessed. Created space and opportunity for patient  and family to explore thoughts and feelings regarding current medical situation.  Today's Discussion: Today saw the patient at the bedside.  She states that she has not had a good day.  Her right side/back area has been hurting, likely from significant tumor burden in her liver.  We use this opportunity to call the nurse and ask for a pain pill which she brought shortly thereafter.  We discussed her discussions on whether or not she wants treatment with her daughter.  She states she did talk to her daughter and told her that she really does not want treatment.  She states it was a very emotional and tearful conversation.  Her daughter eventually told her that she understands.  It seems that her daughter is supporting her decision to not [place] treatment due to the likelihood it would not add much time, most definitely would not add quality, to the time that she has left.  She confirms her decision for no cancer treatment and to rather enjoy the time she has left.  We had more discussion  on her faith and aspects of miracles.  We discussed that the miracle we ask for is not always a miracle we get.  She continues to pray for miraculous recovery but is excepting that she may not get this.  She states she is not afraid to die and not sad to die.  Finally, we discussed the plan to discharge to SNF/rehab.  It appears from speaking with the healthcare staff that the plan is to go to rehab and then transition to long-term care.  We did discuss hospice, given that she is not going to accept treatment.  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  I explained that she cannot do this while she is in rehab level and this would need to occur after she transitions to long-term care level and/or goes to home or with her daughter.  She verbalized understanding.  She states is okay to reach out to hospice.  I spoke with TOC who explained that they cannot refer to hospice until she is a long-term care level.  However, she will apply a list of hospice agencies that the patient and/or family can contact to enroll with services at the appropriate time.  I provided emotional and general support through therapeutic listening, empathy, sharing of stories, and other techniques. I answered all questions and addressed all concerns to the best of my ability.  Review of Systems  Constitutional:  Positive for fatigue.  Respiratory:  Negative for cough and shortness of breath.   Cardiovascular:  Negative  for chest pain.  Gastrointestinal:  Positive for abdominal pain (Right side/back). Negative for nausea and vomiting.    Objective:   Vital Signs:  BP (!) 141/74 (BP Location: Left Arm)   Pulse (!) 104   Temp 98.9 F (37.2 C) (Oral)   Resp 18   Ht 5\' 5"  (1.651 m)   Wt 76.9 kg   SpO2 97%   BMI 28.21 kg/m   Physical Exam: Physical Exam Vitals and nursing note reviewed.  Constitutional:      General: She is not in acute distress.    Appearance: She is  ill-appearing.  HENT:     Head: Normocephalic and atraumatic.  Pulmonary:     Effort: Pulmonary effort is normal. No respiratory distress.  Abdominal:     General: Abdomen is flat.  Skin:    General: Skin is warm and dry.  Neurological:     General: No focal deficit present.     Mental Status: She is alert.  Psychiatric:        Mood and Affect: Mood normal.        Behavior: Behavior normal.     Palliative Assessment/Data: 60%    Existing Vynca/ACP Documentation: None  Assessment & Plan:   Impression: Present on Admission:  Sepsis [facility])  Unfortunate situation was 72 year old female who has been newly diagnosed with extensive liver metastasis, unknown primary. Biopsy and labs are still pending. She has not made a final decision on whether she would want treatment yet, although she is heavily leaning towards no treatment given that even with treatment her life expectancy is only 6 to 12 months. She seems to prefer maximizing her quality of life, even if it is shorter. However, she needs to speak with her daughter more about this given that her daughter wants her to seek treatment. She is elected DNR and specifically does not want feeding tubes either. I will follow-up tomorrow to discuss if she is needed final decision on chemotherapy. Palliative medicine will continue to support, overall prognosis very poor.   SUMMARY OF RECOMMENDATIONS   Remain DNR-limited She has made decision for no cancer treatment Anticipate discharge to SNF/rehab in 24 to 48 hours Continued emotional spiritual support of patient and family Given the goals are clear palliative medicine will sign off Feel free to reconsult [place] for any significant clinical change that warrants additional palliative involvement  Symptom Management:  Per primary team PMT is available to assist as needed  Code Status: DNR-limited  Prognosis: < 6 months  Discharge Planning:  SNF/Rehab with eventual LTC with  hospice  Discussed with: Patient, medical team, nursing team, [facility] team  Thank you for allowing [place] to participate in the care of Jeanne Mcclure PMT will continue to support holistically.  Time Total: 45 min  Detailed review of medical records (labs, imaging, vital signs), medically appropriate exam, discussed with treatment team, counseling and education to patient, family, & staff, documenting clinical information, medication management, coordination of care  Wynne Dust, NP Palliative Medicine Team  Team Phone # [phone] (Nights/Weekends)  02/27/2021, 8:17 AM

## 2023-05-03 NOTE — TOC Progression Note (Signed)
Transition of Care [facility]) - Progression Note    Patient Details  Name: Jeanne Mcclure MRN: [id number] Date of Birth: 16-Aug-1950  Transition of Care [facility]) CM/SW Contact  Adrian Prows, RN Phone Number: 05/03/2023, 3:55 PM  Clinical Narrative:    [facility] notified patient would like list of  outpatient hospice to contact and arrange services after SNF/rehab stay; spoke w/ pt in room; list given to pt; explained she will will have to be evaluated by agency after d/c from rehab; she or her family will need to contact the agency of choice; as far as hospice at SNF, the facility of choice may have a contract agency; pt verbalized understanding, and says she would like to go to [facility]; notified a Rayna Sexton, Admissions Director of pt choice; still awaiting level 2 PASRR, and ins auth needed.   Expected Discharge Plan: Skilled Nursing Facility Barriers to Discharge: Awaiting State Approval [facility])  Expected Discharge Plan and Services   Discharge Planning Services: CM Consult Post Acute Care Choice: Resumption of Svcs/PTA Provider Living arrangements for the past 2 months: Single Family Home                                       Social Determinants of Health (SDOH) Interventions SDOH Screenings   Food Insecurity: No Food Insecurity (04/28/2023)  Housing: Low Risk  (04/28/2023)  Transportation Needs: No Transportation Needs (04/28/2023)  Utilities: Not At Risk (04/28/2023)  Tobacco Use: High Risk (04/30/2023)    Readmission Risk Interventions     No data to display

## 2023-05-04 DIAGNOSIS — C787 Secondary malignant neoplasm of liver and intrahepatic bile duct: Secondary | ICD-10-CM | POA: Diagnosis not present

## 2023-05-04 DIAGNOSIS — F39 Unspecified mood [affective] disorder: Secondary | ICD-10-CM | POA: Insufficient documentation

## 2023-05-04 DIAGNOSIS — I10 Essential (primary) hypertension: Secondary | ICD-10-CM | POA: Insufficient documentation

## 2023-05-04 DIAGNOSIS — C801 Malignant (primary) neoplasm, unspecified: Secondary | ICD-10-CM | POA: Diagnosis not present

## 2023-05-04 DIAGNOSIS — K5792 Diverticulitis of intestine, part unspecified, without perforation or abscess without bleeding: Secondary | ICD-10-CM | POA: Insufficient documentation

## 2023-05-04 NOTE — Progress Notes (Signed)
Patient's Peripheral IV was observed to be  out upon assessment and efforts to put in a new one was refused by patient.

## 2023-05-04 NOTE — Progress Notes (Signed)
Physical Therapy Treatment Patient Details Name: Jeanne Mcclure MRN: [id number] DOB: Oct 13, 1950 Today's Date: 05/04/2023   History of Present Illness 72 y.o. female with hx hypertension, diabetes, COPD, degenerative arthritis, bipolar disorder, recent ED visit 10/19 with suicidal ideation ultimately cleared by psychiatry, who presents due to persistent right upper quadrant pain.  Found to have multiple hypodense liver lesions, suspected metastases with unknown primary.  Septic likely from intrahepatic cholestasis.    PT Comments  Pt agreeable to working with therapy. Abd/R UQ pain 8/10. Mobility is slowly progressing. Pt is hopeful to be able to go to rehab short term to regain functional independence and to improve her strength prior to returning home.     If plan is discharge home, recommend the following: Assist for transportation;A little help with walking and/or transfers;A little help with bathing/dressing/bathroom   Can travel by private vehicle     Yes  Equipment Recommendations  Rollator (4 wheels) (if pt agreaeble to using one)    Recommendations for Other Services       Precautions / Restrictions Precautions Precautions: Fall Restrictions Weight Bearing Restrictions: No     Mobility  Bed Mobility               General bed mobility comments: sitting EOB    Transfers Overall transfer level: Needs assistance   Transfers: Sit to/from Stand Sit to Stand: Supervision           General transfer comment: Supv for safety. Increased time.    Ambulation/Gait Ambulation/Gait assistance: Contact guard assist Gait Distance (Feet): 75 Feet Assistive device: None Gait Pattern/deviations: Step-through pattern, Decreased stride length       General Gait Details: Gait is slow, effortful, guarded. Very close guarding for safety. Pt intermittently used hallway handrail-she declined walker use on today. Unsteady. Fall risk.   Stairs             Wheelchair  Mobility     Tilt Bed    Modified Rankin (Stroke Patients Only)       Balance Overall balance assessment: Needs assistance           Standing balance-Leahy Scale: Fair                              Cognition Arousal: Alert Behavior During Therapy: Flat affect Overall Cognitive Status: Within Functional Limits for tasks assessed                                          Exercises      General Comments        Pertinent Vitals/Pain Pain Assessment Pain Assessment: 0-10 Pain Score: 8  Pain Location: R side abdomen/R UQ Pain Descriptors / Indicators: Sore, Cramping Pain Intervention(s): Limited activity within patient's tolerance, Monitored during session, Repositioned, Patient requesting pain meds-RN notified    Home Living                          Prior Function            PT Goals (current goals can now be found in the care plan section) Progress towards PT goals: Progressing toward goals    Frequency    Min 1X/week      PT Plan      Co-evaluation  AM-PAC PT "6 Clicks" Mobility   Outcome Measure  Help needed turning from your back to your side while in a flat bed without using bedrails?: None Help needed moving from lying on your back to sitting on the side of a flat bed without using bedrails?: None Help needed moving to and from a bed to a chair (including a wheelchair)?: None Help needed standing up from a chair using your arms (e.g., wheelchair or bedside chair)?: None Help needed to walk in hospital room?: A Little Help needed climbing 3-5 steps with a railing? : A Lot 6 Click Score: 21    End of Session   Activity Tolerance: Patient tolerated treatment well;Patient limited by pain Patient left: in bed;with call bell/phone within reach   PT Visit Diagnosis: Difficulty in walking, not elsewhere classified (R26.2);Muscle weakness (generalized) (M62.81);Pain     Time:  0981-1914 PT Time Calculation (min) (ACUTE ONLY): 10 min  Charges:    $Gait Training: 8-22 mins PT General Charges $$ ACUTE PT VISIT: 1 Visit                        Faye Ramsay, PT Acute Rehabilitation  Office: [phone]

## 2023-05-04 NOTE — Plan of Care (Signed)
  Problem: Health Behavior/Discharge Planning: Goal: Ability to manage health-related needs will improve Outcome: Progressing   Problem: Clinical Measurements: Goal: Ability to maintain clinical measurements within normal limits will improve Outcome: Progressing Goal: Will remain free from infection Outcome: Progressing Goal: Diagnostic test results will improve Outcome: Progressing Goal: Cardiovascular complication will be avoided Outcome: Progressing   Problem: Nutrition: Goal: Adequate nutrition will be maintained Outcome: Progressing   Problem: Coping: Goal: Level of anxiety will decrease Outcome: Progressing   Problem: Elimination: Goal: Will not experience complications related to bowel motility Outcome: Progressing Goal: Will not experience complications related to urinary retention Outcome: Progressing   Problem: Pain Management: Goal: General experience of comfort will improve Outcome: Progressing   Problem: Safety: Goal: Ability to remain free from injury will improve Outcome: Progressing   Problem: Skin Integrity: Goal: Risk for impaired skin integrity will decrease Outcome: Progressing   Problem: Education: Goal: Ability to describe self-care measures that may prevent or decrease complications (Diabetes Survival Skills Education) will improve Outcome: Progressing Goal: Individualized Educational Video(s) Outcome: Progressing   Problem: Coping: Goal: Ability to adjust to condition or change in health will improve Outcome: Progressing   Problem: Fluid Volume: Goal: Ability to maintain a balanced intake and output will improve Outcome: Progressing   Problem: Health Behavior/Discharge Planning: Goal: Ability to identify and utilize available resources and services will improve Outcome: Progressing Goal: Ability to manage health-related needs will improve Outcome: Progressing   Problem: Metabolic: Goal: Ability to maintain appropriate glucose levels  will improve Outcome: Progressing   Problem: Nutritional: Goal: Maintenance of adequate nutrition will improve Outcome: Progressing Goal: Progress toward achieving an optimal weight will improve Outcome: Progressing   Problem: Skin Integrity: Goal: Risk for impaired skin integrity will decrease Outcome: Progressing   Problem: Tissue Perfusion: Goal: Adequacy of tissue perfusion will improve Outcome: Progressing

## 2023-05-04 NOTE — Progress Notes (Signed)
PROGRESS NOTE    Jeanne Mcclure  ZOX:[id number] DOB: [birth year] DOA: 04/27/2023 PCP: Pcp, No     Brief Narrative:  Jeanne Mcclure is a 72 year old female with hypertension diabetes COPD/asthma,DJD, bipolar disorder who presented to the hospital 04/27/2023 with complaints of right upper quadrant abdominal pain, flank pain as well as dark-colored urine. MRCP showed>Severe hepatomegaly with infiltrative pattern of innumerable hypovascular lesions throughout the liver, presumably reflective of widespread metastatic disease. No definite primary source confidently identified.  Patient underwent ultrasound-guided biopsy on 10/30.  New events last 24 hours / Subjective: No new complaints overnight.  Currently pending biopsy pathology results as well as insurance auth/SNF placement.  Assessment & Plan:   Principal Problem:   Metastasis to liver of unknown origin [facility]) Active Problems:   Sepsis (HCC)   Other constipation   Acute diverticulitis   Essential hypertension   Mood disorder (HCC)   Diffuse metastasis to the liver -Status post MRCP, biopsy. Pathology pending -Favor GI primary -AFP 484, CEA 245 -MRI brain without acute intracranial abnormality, no evidence for intracranial metastatic disease -Patient has voiced not wanting to pursue treatment at this time -Oncology signed off 11/1 -Palliative care medicine following  Acute diverticulitis, severe sepsis POA -Zosyn --> Augmentin to complete 10-day course  Hypertension -Metoprolol  Diabetes mellitus -SSI   Mood disorder -Recent episode of suicidal ideation, cleared by psychiatry -Gabapentin   DVT prophylaxis:  SCDs Start: 04/27/23 2324  Code Status: DNR, no CPR, DNI Family Communication: None at bedside Disposition Plan: SNF placement pending Status is: Inpatient Remains inpatient appropriate because: SNF placement pending    Antimicrobials:  Anti-infectives (From admission, onward)    Start     Dose/Rate Route  Frequency Ordered Stop   05/02/23 1430  amoxicillin-clavulanate (AUGMENTIN) 875-125 MG per tablet 1 tablet        1 tablet Oral Every 12 hours 05/02/23 1338     04/28/23 0600  piperacillin-tazobactam (ZOSYN) IVPB 3.375 g  Status:  Discontinued        3.375 g 12.5 mL/hr over 240 Minutes Intravenous Every 8 hours 04/27/23 2332 05/02/23 1338   04/27/23 2245  piperacillin-tazobactam (ZOSYN) IVPB 3.375 g        3.375 g 100 mL/hr over 30 Minutes Intravenous  Once 04/27/23 2230 04/28/23 0019        Objective: Vitals:   05/03/23 2004 05/04/23 0500 05/04/23 0622 05/04/23 1229  BP: 124/67  123/73 (!) 145/73  Pulse:   (!) 117 91  Resp: 20  20 16   Temp: 98.3 F (36.8 C)  98.2 F (36.8 C) 98.5 F (36.9 C)  TempSrc: Oral  Oral Oral  SpO2: 97%  96%   Weight:  82 kg    Height:        Intake/Output Summary (Last 24 hours) at 05/04/2023 1404 Last data filed at 05/04/2023 0908 Gross per 24 hour  Intake 360 ml  Output --  Net 360 ml   Filed Weights   05/01/23 0500 05/03/23 0500 05/04/23 0500  Weight: 79.9 kg 76.9 kg 82 kg    Examination:  General exam: Appears calm and comfortable  Respiratory system: Clear to auscultation. Respiratory effort normal. No respiratory distress. No conversational dyspnea.  Cardiovascular system: S1 & S2 heard, RRR. No murmurs. No pedal edema. Gastrointestinal system: Abdomen is nondistended, soft and nontender. Normal bowel sounds heard. Central nervous system: Alert and oriented. No focal neurological deficits. Speech clear.  Extremities: Symmetric in appearance  Skin: No rashes, lesions or ulcers on exposed  skin  Psychiatry: Judgement and insight appear normal. Mood & affect appropriate.   Data Reviewed: I have personally reviewed following labs and imaging studies  CBC: Recent Labs  Lab 04/27/23 1719 04/27/23 1739 04/28/23 0559 04/30/23 1203 05/01/23 0458 05/02/23 0507 05/03/23 0524  WBC 8.3  --  8.5 6.9 7.7 8.1 8.7  NEUTROABS 6.5  --   --    --   --   --   --   HGB 10.8*   < > 10.3* 9.1* 10.0* 9.3* 9.6*  HCT 32.3*   < > 30.8* 28.0* 31.3* 28.5* 29.8*  MCV 95.3  --  96.9 98.6 98.4 96.6 99.3  PLT 452*  --  439* 426* 426* 439* 462*   < > = values in this interval not displayed.   Basic Metabolic Panel: Recent Labs  Lab 04/27/23 1719 04/27/23 1739 04/28/23 0559 04/29/23 1259 04/30/23 1203 05/01/23 0458 05/02/23 0507 05/03/23 0524  NA 136   < > 136 136 136 135 138 133*  K 3.3*   < > 4.0 3.3* 3.6 3.9 3.7 3.8  CL 98   < > 100 98 102 102 99 97*  CO2 23  --  23 25 23  21* 22 21*  GLUCOSE 83   < > 65* 85 77 77 86 76  BUN 10   < > 8 7* 6* 10 10 9   CREATININE 0.45   < > 0.55 0.47 0.50 0.51 0.44 0.46  CALCIUM 9.3  --  9.1 8.9 8.3* 8.7* 8.9 8.8*  MG 1.9  --  1.7  --   --   --   --   --   PHOS  --   --  3.4  --   --   --   --   --    < > = values in this interval not displayed.   GFR: Estimated Creatinine Clearance: 68.2 mL/min (by C-G formula based on SCr of 0.46 mg/dL). Liver Function Tests: Recent Labs  Lab 04/27/23 1719 04/28/23 0559  AST 152* 153*  ALT 57* 53*  ALKPHOS 278* 237*  BILITOT 3.6* 4.1*  PROT 6.6 6.2*  ALBUMIN 3.0* 2.8*   Recent Labs  Lab 04/27/23 1719  LIPASE 21   No results for input(s): "AMMONIA" in the last 168 hours. Coagulation Profile: Recent Labs  Lab 04/28/23 0559  INR 1.2   Cardiac Enzymes: No results for input(s): "CKTOTAL", "CKMB", "CKMBINDEX", "TROPONINI" in the last 168 hours. BNP (last 3 results) No results for input(s): "PROBNP" in the last 8760 hours. HbA1C: No results for input(s): "HGBA1C" in the last 72 hours. CBG: Recent Labs  Lab 04/28/23 2230 04/29/23 0342 04/29/23 0736 04/29/23 1640 05/01/23 0807  GLUCAP 80 74 69* 82 83   Lipid Profile: No results for input(s): "CHOL", "HDL", "LDLCALC", "TRIG", "CHOLHDL", "LDLDIRECT" in the last 72 hours. Thyroid Function Tests: No results for input(s): "TSH", "T4TOTAL", "FREET4", "T3FREE", "THYROIDAB" in the last 72  hours. Anemia Panel: No results for input(s): "VITAMINB12", "FOLATE", "FERRITIN", "TIBC", "IRON", "RETICCTPCT" in the last 72 hours. Sepsis Labs: Recent Labs  Lab 04/27/23 1852 04/27/23 2036 04/28/23 0039 04/28/23 0559  LATICACIDVEN 3.2* 3.4* 3.1* 2.9*    No results found for this or any previous visit (from the past 240 hour(s)).    Radiology Studies: No results found.    Scheduled Meds:  amoxicillin-clavulanate  1 tablet Oral Q12H   feeding supplement  1 Container Oral TID BM   gabapentin  200 mg Oral QHS  insulin aspart  0-6 Units Subcutaneous TID WC   metoprolol tartrate  12.5 mg Oral BID   montelukast  10 mg Oral QHS   polyethylene glycol  17 g Oral Daily   sodium chloride flush  3 mL Intravenous Q12H   Continuous Infusions:   LOS: 7 days   Time spent: 30 minutes   Noralee Stain, DO [facility] 05/04/2023, 2:04 PM   Available via Epic secure chat 7am-7pm After these hours, please refer to coverage provider listed on [email or web address]

## 2023-05-04 NOTE — Plan of Care (Signed)
  Problem: Coping: Goal: Level of anxiety will decrease Outcome: Progressing   Problem: Pain Management: Goal: General experience of comfort will improve Outcome: Progressing

## 2023-05-05 DIAGNOSIS — C787 Secondary malignant neoplasm of liver and intrahepatic bile duct: Secondary | ICD-10-CM | POA: Diagnosis not present

## 2023-05-05 DIAGNOSIS — C801 Malignant (primary) neoplasm, unspecified: Secondary | ICD-10-CM | POA: Diagnosis not present

## 2023-05-05 MED ORDER — ALUM & MAG HYDROXIDE-SIMETH 200-200-20 MG/5ML PO SUSP
15.0000 mL | Freq: Four times a day (QID) | ORAL | Status: DC | PRN
  Administered 2023-05-05 – 2023-05-06 (×2): 15 mL via ORAL
  Filled 2023-05-05 (×2): qty 30

## 2023-05-05 NOTE — Plan of Care (Signed)
  Problem: Coping: Goal: Level of anxiety will decrease Outcome: Progressing   Problem: Pain Management: Goal: General experience of comfort will improve Outcome: Progressing   Problem: Safety: Goal: Ability to remain free from injury will improve Outcome: Progressing

## 2023-05-05 NOTE — TOC Progression Note (Addendum)
Transition of Care [facility]) - Progression Note    Patient Details  Name: Jeanne Mcclure MRN: [id number] Date of Birth: [birth year]  Transition of Care [facility]) CM/SW Contact  Litha Lamartina, Olegario Messier, RN Phone Number: 05/05/2023, 10:30 AM  Clinical Narrative: Deana(dtr) chose [facility] rep Kia aware;awaiting level 2 pasrr assigned; will need auth once pasrr received.   -1:31p received Doroteo Bradford WN#[id number] auth till 11/6 for [facility] rep Kia aware-still awaiting pasrr.    Expected Discharge Plan: Skilled Nursing Facility Barriers to Discharge: Awaiting State Approval [facility])  Expected Discharge Plan and Services   Discharge Planning Services: CM Consult Post Acute Care Choice: Resumption of Svcs/PTA Provider Living arrangements for the past 2 months: Single Family Home                                       Social Determinants of Health (SDOH) Interventions SDOH Screenings   Food Insecurity: No Food Insecurity (04/28/2023)  Housing: Low Risk  (04/28/2023)  Transportation Needs: No Transportation Needs (04/28/2023)  Utilities: Not At Risk (04/28/2023)  Tobacco Use: High Risk (04/30/2023)    Readmission Risk Interventions     No data to display

## 2023-05-05 NOTE — Progress Notes (Signed)
Mobility Specialist - Progress Note   05/05/23 1143  Mobility  Activity Ambulated independently to bathroom;Ambulated independently in hallway  Level of Assistance Standby assist, set-up cues, supervision of patient - no hands on  Assistive Device None  Distance Ambulated (ft) 200 ft  Range of Motion/Exercises Active  Activity Response Tolerated well  Mobility Referral Yes  $Mobility charge 1 Mobility  Mobility Specialist Start Time (ACUTE ONLY) 1130  Mobility Specialist Stop Time (ACUTE ONLY) 1140  Mobility Specialist Time Calculation (min) (ACUTE ONLY) 10 min   Pt was found in bed and agreeable to ambulate after bathroom use. Pt declined use of gait belt due to increasing pain on R abdominal side. Pt c/o soreness and aching from R abdominal side. At EOS returned to bed with all needs met. Call bell in reach.  Billey Chang Mobility Specialist

## 2023-05-05 NOTE — Progress Notes (Signed)
PROGRESS NOTE    Korrine Sicard  HQI:[id number] DOB: [birth year] DOA: 04/27/2023 PCP: Pcp, No     Brief Narrative:  Jeanne Mcclure is a 72 year old female with hypertension diabetes COPD/asthma,DJD, bipolar disorder who presented to the hospital 04/27/2023 with complaints of right upper quadrant abdominal pain, flank pain as well as dark-colored urine. MRCP showed>Severe hepatomegaly with infiltrative pattern of innumerable hypovascular lesions throughout the liver, presumably reflective of widespread metastatic disease. No definite primary source confidently identified.  Patient underwent ultrasound-guided biopsy on 10/30.  Biopsy results are still pending but patient has voiced that does not want any further cancer treatment therefore oncology and palliative care service has signed off for now.  Patient does remain DNR/DNI.  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  MRI brain is negative.  Liver biopsy from 10/30 results are still pending.  Patient has voiced that she does not want to pursue any further medical treatments therefore oncology team has signed off.  Patient is also been seen by palliative care service.    Acute diverticulitis, severe sepsis POA -Zosyn --> Augmentin to complete 10-day course.  EOT 11/6   Hypertension -Metoprolol.  IV as needed   Diabetes mellitus -SSI    Mood disorder Continue gabapentin.  Recently had suicidal ideation, cleared by psychiatry     DVT prophylaxis:  SCDs Start: 04/27/23 2324   Code Status: DNR, no CPR, DNI Family Communication: None at bedside Disposition Plan: SNF placement pending Status is: Inpatient Remains inpatient appropriate because:  SNF placement pending    Subjective: Patient does not have any complaints.  Tells me she wants to know her biopsy result but does not plan on seeking any treatment   Examination:  General exam: Appears calm and comfortable  Respiratory system: Clear to auscultation. Respiratory effort normal. No respiratory distress. No conversational dyspnea.  Cardiovascular system: S1 & S2 heard, RRR. No murmurs. No pedal edema. Gastrointestinal system: Abdomen is nondistended, soft and nontender. Normal bowel sounds heard. Central nervous system: Alert and oriented. No focal neurological deficits. Speech clear.  Extremities: Symmetric in appearance  Skin: No rashes, lesions or ulcers on exposed skin  Psychiatry: Judgement and insight appear normal. Mood & affect appropriate.             Diet Orders (From admission, onward)     Start     Ordered   04/30/23 1151  Diet regular Room service appropriate? Yes; Fluid consistency: Thin  Diet effective now       Question Answer Comment  Room service appropriate? Yes   Fluid consistency: Thin      04/30/23 1151            Objective: Vitals:   05/04/23 0622 05/04/23 1229 05/04/23 2014 05/05/23 0433  BP: 123/73 (!) 145/73 (!) 145/81 (!) 143/85  Pulse: (!) 117 91 (!) 111 (!) 110  Resp: 20 16 17 17   Temp: 98.2 F (36.8 C) 98.5 F (36.9 C) 98.1 F (36.7 C) 98.1 F (36.7 C)  TempSrc: Oral Oral Oral Oral  SpO2: 96%  96% 97%  Weight:      Height:        Intake/Output Summary (Last  24 hours) at 05/05/2023 1250 Last data filed at 05/04/2023 1900 Gross per 24 hour  Intake 240 ml  Output --  Net 240 ml   Filed Weights   05/01/23 0500 05/03/23 0500 05/04/23 0500  Weight: 79.9 kg 76.9 kg 82 kg    Scheduled Meds:  amoxicillin-clavulanate  1 tablet Oral Q12H   feeding supplement  1 Container Oral TID BM   gabapentin  200 mg Oral QHS   insulin aspart  0-6 Units Subcutaneous TID WC   metoprolol tartrate  12.5 mg Oral BID   montelukast   10 mg Oral QHS   polyethylene glycol  17 g Oral Daily   sodium chloride flush  3 mL Intravenous Q12H   Continuous Infusions:  Nutritional status Signs/Symptoms: estimated needs Interventions: Boost Breeze Body mass index is 30.08 kg/m.  Data Reviewed:   CBC: Recent Labs  Lab 04/30/23 1203 05/01/23 0458 05/02/23 0507 05/03/23 0524  WBC 6.9 7.7 8.1 8.7  HGB 9.1* 10.0* 9.3* 9.6*  HCT 28.0* 31.3* 28.5* 29.8*  MCV 98.6 98.4 96.6 99.3  PLT 426* 426* 439* 462*   Basic Metabolic Panel: Recent Labs  Lab 04/29/23 1259 04/30/23 1203 05/01/23 0458 05/02/23 0507 05/03/23 0524  NA 136 136 135 138 133*  K 3.3* 3.6 3.9 3.7 3.8  CL 98 102 102 99 97*  CO2 25 23 21* 22 21*  GLUCOSE 85 77 77 86 76  BUN 7* 6* 10 10 9   CREATININE 0.47 0.50 0.51 0.44 0.46  CALCIUM 8.9 8.3* 8.7* 8.9 8.8*   GFR: Estimated Creatinine Clearance: 68.2 mL/min (by C-G formula based on SCr of 0.46 mg/dL). Liver Function Tests: No results for input(s): "AST", "ALT", "ALKPHOS", "BILITOT", "PROT", "ALBUMIN" in the last 168 hours. No results for input(s): "LIPASE", "AMYLASE" in the last 168 hours. No results for input(s): "AMMONIA" in the last 168 hours. Coagulation Profile: No results for input(s): "INR", "PROTIME" in the last 168 hours. Cardiac Enzymes: No results for input(s): "CKTOTAL", "CKMB", "CKMBINDEX", "TROPONINI" in the last 168 hours. BNP (last 3 results) No results for input(s): "PROBNP" in the last 8760 hours. HbA1C: No results for input(s): "HGBA1C" in the last 72 hours. CBG: Recent Labs  Lab 04/28/23 2230 04/29/23 0342 04/29/23 0736 04/29/23 1640 05/01/23 0807  GLUCAP 80 74 69* 82 83   Lipid Profile: No results for input(s): "CHOL", "HDL", "LDLCALC", "TRIG", "CHOLHDL", "LDLDIRECT" in the last 72 hours. Thyroid Function Tests: No results for input(s): "TSH", "T4TOTAL", "FREET4", "T3FREE", "THYROIDAB" in the last 72 hours. Anemia Panel: No results for input(s): "VITAMINB12",  "FOLATE", "FERRITIN", "TIBC", "IRON", "RETICCTPCT" in the last 72 hours. Sepsis Labs: No results for input(s): "PROCALCITON", "LATICACIDVEN" in the last 168 hours.  No results found for this or any previous visit (from the past 240 hour(s)).       Radiology Studies: No results found.         LOS: 8 days   Time spent= 35 mins    Miguel Rota, MD [facility]  If 7PM-7AM, please contact night-coverage  05/05/2023, 12:50 PM

## 2023-05-06 DIAGNOSIS — C787 Secondary malignant neoplasm of liver and intrahepatic bile duct: Secondary | ICD-10-CM | POA: Diagnosis not present

## 2023-05-06 DIAGNOSIS — C801 Malignant (primary) neoplasm, unspecified: Secondary | ICD-10-CM | POA: Diagnosis not present

## 2023-05-06 LAB — SURGICAL PATHOLOGY

## 2023-05-06 MED ORDER — MELATONIN 5 MG PO TABS
5.0000 mg | ORAL_TABLET | Freq: Once | ORAL | Status: AC
  Administered 2023-05-06: 5 mg via ORAL
  Filled 2023-05-06: qty 1

## 2023-05-06 MED ORDER — METOPROLOL TARTRATE 25 MG PO TABS: 25.0000 mg | ORAL_TABLET | Freq: Two times a day (BID) | ORAL | Status: DC

## 2023-05-06 MED ORDER — CYCLOBENZAPRINE HCL 5 MG PO TABS: 5.0000 mg | ORAL_TABLET | Freq: Three times a day (TID) | ORAL | 0 refills | Status: DC | PRN

## 2023-05-06 MED ORDER — POLYETHYLENE GLYCOL 3350 17 G PO PACK: 17.0000 g | PACK | Freq: Every day | ORAL | Status: DC

## 2023-05-06 MED ORDER — METOPROLOL TARTRATE 25 MG PO TABS: 12.5000 mg | ORAL_TABLET | Freq: Two times a day (BID) | ORAL | Status: DC

## 2023-05-06 MED ORDER — METOPROLOL TARTRATE 25 MG PO TABS
25.0000 mg | ORAL_TABLET | Freq: Two times a day (BID) | ORAL | Status: DC
  Administered 2023-05-06 – 2023-05-07 (×3): 25 mg via ORAL
  Filled 2023-05-06 (×3): qty 1

## 2023-05-06 MED ORDER — OXYCODONE HCL 5 MG PO TABS: 10.0000 mg | ORAL_TABLET | ORAL | Status: DC | PRN

## 2023-05-06 MED ORDER — LIDOCAINE 5 % EX PTCH
2.0000 | MEDICATED_PATCH | CUTANEOUS | Status: DC
  Administered 2023-05-06: 2 via TRANSDERMAL
  Filled 2023-05-06 (×2): qty 2

## 2023-05-06 MED ORDER — OXYCODONE HCL 5 MG PO TABS
10.0000 mg | ORAL_TABLET | ORAL | Status: DC | PRN
  Administered 2023-05-06 – 2023-05-07 (×2): 10 mg via ORAL
  Filled 2023-05-06 (×2): qty 2

## 2023-05-06 MED ORDER — AMOXICILLIN-POT CLAVULANATE 875-125 MG PO TABS: 1.0000 | ORAL_TABLET | Freq: Two times a day (BID) | ORAL | Status: AC

## 2023-05-06 MED ORDER — METOPROLOL TARTRATE 25 MG PO TABS: 37.5000 mg | ORAL_TABLET | Freq: Two times a day (BID) | ORAL | Status: DC

## 2023-05-06 NOTE — TOC Progression Note (Signed)
Transition of Care [facility]) - Progression Note    Patient Details  Name: Jeanne Mcclure MRN: [id number] Date of Birth: June 24, 1951  Transition of Care [facility]) CM/SW Contact  Mutasim Tuckey, Olegario Messier, RN Phone Number: 05/06/2023, 1:14 PM  Clinical Narrative:  Level 2 pasrr eval done by natasha-await outcome for pasrr.Has auth for [facility] SNF till 05/07/23.     Expected Discharge Plan: Skilled Nursing Facility Barriers to Discharge: Awaiting State Approval [facility])  Expected Discharge Plan and Services   Discharge Planning Services: CM Consult Post Acute Care Choice: Resumption of Svcs/PTA Provider Living arrangements for the past 2 months: Single Family Home                                       Social Determinants of Health (SDOH) Interventions SDOH Screenings   Food Insecurity: No Food Insecurity (04/28/2023)  Housing: Low Risk  (04/28/2023)  Transportation Needs: No Transportation Needs (04/28/2023)  Utilities: Not At Risk (04/28/2023)  Tobacco Use: High Risk (04/30/2023)    Readmission Risk Interventions     No data to display

## 2023-05-06 NOTE — Plan of Care (Signed)
  Problem: Health Behavior/Discharge Planning: Goal: Ability to manage health-related needs will improve Outcome: Progressing   Problem: Clinical Measurements: Goal: Ability to maintain clinical measurements within normal limits will improve Outcome: Progressing Goal: Will remain free from infection Outcome: Progressing Goal: Diagnostic test results will improve Outcome: Progressing Goal: Cardiovascular complication will be avoided Outcome: Progressing

## 2023-05-06 NOTE — Progress Notes (Signed)
PROGRESS NOTE    Jeanne Mcclure  NAT:[id number] DOB: 1950/07/29 DOA: 04/27/2023 PCP: Pcp, No     Brief Narrative:  Jeanne Mcclure is a 72 year old female with hypertension diabetes COPD/asthma,DJD, bipolar disorder who presented to the hospital 04/27/2023 with complaints of right upper quadrant abdominal pain, flank pain as well as dark-colored urine. MRCP showed>Severe hepatomegaly with infiltrative pattern of innumerable hypovascular lesions throughout the liver, presumably reflective of widespread metastatic disease. No definite primary source confidently identified.  Patient underwent ultrasound-guided biopsy on 10/30.  Biopsy results are still pending but patient has voiced that does not want any further cancer treatment therefore oncology and palliative care service has signed off for now.  Patient does remain DNR/DNI.  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  MRI brain is negative.  Liver biopsy from 10/30 results are still pending.  Patient has voiced that she does not want to pursue any further medical treatments therefore oncology team has signed off.  Patient is also been seen by palliative care service.    Acute diverticulitis, severe sepsis POA -Zosyn --> Augmentin to complete 10-day course.  EOT 11/6   Hypertension Sinus tachycardia -Increase Metoprolol.  IV as needed   Diabetes mellitus -SSI    Mood disorder Continue gabapentin.  Recently had suicidal ideation, cleared by psychiatry     DVT prophylaxis:  SCDs Start: 04/27/23 2324   Code Status: DNR, no CPR, DNI Family Communication: None at bedside Disposition Plan: SNF placement pending Status is: Inpatient Remains  inpatient appropriate because: SNF placement pending    Subjective: No complaints.    Examination:  General exam: Appears calm and comfortable  Respiratory system: Clear to auscultation. Respiratory effort normal. No respiratory distress. No conversational dyspnea.  Cardiovascular system: S1 & S2 heard, RRR. No murmurs. No pedal edema. Gastrointestinal system: Abdomen is nondistended, soft and nontender. Normal bowel sounds heard. Central nervous system: Alert and oriented. No focal neurological deficits. Speech clear.  Extremities: Symmetric in appearance  Skin: No rashes, lesions or ulcers on exposed skin  Psychiatry: Judgement and insight appear normal. Mood & affect appropriate.         e:      Diet Orders (From admission, onward)     Start     Ordered   04/30/23 1151  Diet regular Room service appropriate? Yes; Fluid consistency: Thin  Diet effective now       Question Answer Comment  Room service appropriate? Yes   Fluid consistency: Thin      04/30/23 1151            Objective: Vitals:   05/05/23 1550 05/05/23 2002 05/05/23 2031 05/06/23 0523  BP: 109/66  (!) 140/75 (!) 142/78  Pulse: (!) 102   (!) 117  Resp: 16  20 14   Temp: 98.6 F (37 C)  99.1 F (37.3 C) 99.4 F (37.4 C)  TempSrc: Oral  Oral Oral  SpO2: 96% 100% 99% 96%  Weight:      Height:        Intake/Output Summary (Last 24 hours) at 05/06/2023 1104 Last data filed at 05/06/2023 0908 Gross per 24 hour  Intake 840 ml  Output --  Net 840 ml   Filed Weights   05/01/23 0500 05/03/23 0500 05/04/23 0500  Weight: 79.9 kg 76.9 kg 82 kg    Scheduled Meds:  amoxicillin-clavulanate  1 tablet Oral Q12H   feeding supplement  1 Container Oral TID BM   gabapentin  200 mg Oral QHS   insulin aspart  0-6 Units Subcutaneous TID WC   metoprolol tartrate  25 mg Oral BID   montelukast  10 mg Oral QHS   polyethylene glycol  17 g Oral Daily   sodium chloride flush  3 mL Intravenous Q12H    Continuous Infusions:  Nutritional status Signs/Symptoms: estimated needs Interventions: Boost Breeze Body mass index is 30.08 kg/m.  Data Reviewed:   CBC: Recent Labs  Lab 04/30/23 1203 05/01/23 0458 05/02/23 0507 05/03/23 0524  WBC 6.9 7.7 8.1 8.7  HGB 9.1* 10.0* 9.3* 9.6*  HCT 28.0* 31.3* 28.5* 29.8*  MCV 98.6 98.4 96.6 99.3  PLT 426* 426* 439* 462*   Basic Metabolic Panel: Recent Labs  Lab 04/29/23 1259 04/30/23 1203 05/01/23 0458 05/02/23 0507 05/03/23 0524  NA 136 136 135 138 133*  K 3.3* 3.6 3.9 3.7 3.8  CL 98 102 102 99 97*  CO2 25 23 21* 22 21*  GLUCOSE 85 77 77 86 76  BUN 7* 6* 10 10 9   CREATININE 0.47 0.50 0.51 0.44 0.46  CALCIUM 8.9 8.3* 8.7* 8.9 8.8*   GFR: Estimated Creatinine Clearance: 68.2 mL/min (by C-G formula based on SCr of 0.46 mg/dL). Liver Function Tests: No results for input(s): "AST", "ALT", "ALKPHOS", "BILITOT", "PROT", "ALBUMIN" in the last 168 hours. No results for input(s): "LIPASE", "AMYLASE" in the last 168 hours. No results for input(s): "AMMONIA" in the last 168 hours. Coagulation Profile: No results for input(s): "INR", "PROTIME" in the last 168 hours. Cardiac Enzymes: No results for input(s): "CKTOTAL", "CKMB", "CKMBINDEX", "TROPONINI" in the last 168 hours. BNP (last 3 results) No results for input(s): "PROBNP" in the last 8760 hours. HbA1C: No results for input(s): "HGBA1C" in the last 72 hours. CBG: Recent Labs  Lab 04/29/23 1640 05/01/23 0807  GLUCAP 82 83   Lipid Profile: No results for input(s): "CHOL", "HDL", "LDLCALC", "TRIG", "CHOLHDL", "LDLDIRECT" in the last 72 hours. Thyroid Function Tests: No results for input(s): "TSH", "T4TOTAL", "FREET4", "T3FREE", "THYROIDAB" in the last 72 hours. Anemia Panel: No results for input(s): "VITAMINB12", "FOLATE", "FERRITIN", "TIBC", "IRON", "RETICCTPCT" in the last 72 hours. Sepsis Labs: No results for input(s): "PROCALCITON", "LATICACIDVEN" in the last 168  hours.  No results found for this or any previous visit (from the past 240 hour(s)).       Radiology Studies: No results found.         LOS: 9 days   Time spent= 35 mins    Miguel Rota, MD [facility]  If 7PM-7AM, please contact night-coverage  05/06/2023, 11:04 AM

## 2023-05-06 NOTE — Progress Notes (Signed)
PT Cancellation Note  Patient Details Name: Jeanne Mcclure MRN: [id number] DOB: 15-Mar-1951   Cancelled Treatment:    Reason Eval/Treat Not Completed: Patient declined, no reason specified; pt states she does not feel like doing therapy today, encouraged and pt continued to decline   [facility] 05/06/2023, 12:16 PM

## 2023-05-06 NOTE — TOC Progression Note (Signed)
Transition of Care [facility]) - Progression Note    Patient Details  Name: Jeanne Mcclure MRN: [id number] Date of Birth: 04-25-1951  Transition of Care [facility]) CM/SW Contact  Anye Brose, Olegario Messier, RN Phone Number: 05/06/2023, 12:09 PM  Clinical Narrative:  Level 2 pasrr eval to be done by Marcelle Smiling around 12:45p-Will come to patient's rm at that time. Nsg aware.     Expected Discharge Plan: Skilled Nursing Facility Barriers to Discharge: Awaiting State Approval [facility])  Expected Discharge Plan and Services   Discharge Planning Services: CM Consult Post Acute Care Choice: Resumption of Svcs/PTA Provider Living arrangements for the past 2 months: Single Family Home                                       Social Determinants of Health (SDOH) Interventions SDOH Screenings   Food Insecurity: No Food Insecurity (04/28/2023)  Housing: Low Risk  (04/28/2023)  Transportation Needs: No Transportation Needs (04/28/2023)  Utilities: Not At Risk (04/28/2023)  Tobacco Use: High Risk (04/30/2023)    Readmission Risk Interventions     No data to display

## 2023-05-07 DIAGNOSIS — C801 Malignant (primary) neoplasm, unspecified: Secondary | ICD-10-CM | POA: Diagnosis not present

## 2023-05-07 DIAGNOSIS — C787 Secondary malignant neoplasm of liver and intrahepatic bile duct: Secondary | ICD-10-CM | POA: Diagnosis not present

## 2023-05-07 NOTE — TOC Progression Note (Signed)
Transition of Care [facility]) - Progression Note    Patient Details  Name: Jeanne Mcclure MRN: [id number] Date of Birth: 09-25-1950  Transition of Care [facility]) CM/SW Contact  Danetra Glock, Olegario Messier, RN Phone Number: 05/07/2023, 12:01 PM  Clinical Narrative: Received pasrr already had auth for [facility]-went to talk to patient who states she is going home since she has been her to many days waiting to discharge.Tried to remind her of going to rehab for strengthening exercises she lives home alone. MD/Nsg notified. Left vm w/dtr Deana await call back.       Expected Discharge Plan: Skilled Nursing Facility Barriers to Discharge: No Barriers Identified  Expected Discharge Plan and Services   Discharge Planning Services: CM Consult Post Acute Care Choice: Resumption of Svcs/PTA Provider Living arrangements for the past 2 months: Single Family Home                                       Social Determinants of Health (SDOH) Interventions SDOH Screenings   Food Insecurity: No Food Insecurity (04/28/2023)  Housing: Low Risk  (04/28/2023)  Transportation Needs: No Transportation Needs (04/28/2023)  Utilities: Not At Risk (04/28/2023)  Tobacco Use: High Risk (04/30/2023)    Readmission Risk Interventions     No data to display

## 2023-05-07 NOTE — Plan of Care (Signed)
  Problem: Health Behavior/Discharge Planning: Goal: Ability to manage health-related needs will improve Outcome: Progressing   Problem: Clinical Measurements: Goal: Ability to maintain clinical measurements within normal limits will improve Outcome: Progressing Goal: Will remain free from infection Outcome: Progressing Goal: Diagnostic test results will improve Outcome: Progressing Goal: Cardiovascular complication will be avoided Outcome: Progressing   Problem: Nutrition: Goal: Adequate nutrition will be maintained Outcome: Progressing   Problem: Coping: Goal: Level of anxiety will decrease Outcome: Progressing   Problem: Elimination: Goal: Will not experience complications related to bowel motility Outcome: Progressing Goal: Will not experience complications related to urinary retention Outcome: Progressing   Problem: Pain Management: Goal: General experience of comfort will improve Outcome: Progressing   Problem: Safety: Goal: Ability to remain free from injury will improve Outcome: Progressing   Problem: Skin Integrity: Goal: Risk for impaired skin integrity will decrease Outcome: Progressing   Problem: Education: Goal: Ability to describe self-care measures that may prevent or decrease complications (Diabetes Survival Skills Education) will improve Outcome: Progressing Goal: Individualized Educational Video(s) Outcome: Progressing   Problem: Coping: Goal: Ability to adjust to condition or change in health will improve Outcome: Progressing   Problem: Fluid Volume: Goal: Ability to maintain a balanced intake and output will improve Outcome: Progressing   Problem: Health Behavior/Discharge Planning: Goal: Ability to identify and utilize available resources and services will improve Outcome: Progressing Goal: Ability to manage health-related needs will improve Outcome: Progressing   Problem: Metabolic: Goal: Ability to maintain appropriate glucose levels  will improve Outcome: Progressing   Problem: Nutritional: Goal: Maintenance of adequate nutrition will improve Outcome: Progressing Goal: Progress toward achieving an optimal weight will improve Outcome: Progressing   Problem: Skin Integrity: Goal: Risk for impaired skin integrity will decrease Outcome: Progressing   Problem: Tissue Perfusion: Goal: Adequacy of tissue perfusion will improve Outcome: Progressing

## 2023-05-07 NOTE — TOC Transition Note (Signed)
Transition of Care [facility]) - CM/SW Discharge Note   Patient Details  Name: Jeanne Mcclure MRN: [id number] Date of Birth: Jul 16, 1950  Transition of Care [facility]) CM/SW Contact:  Lanier Clam, RN Phone Number: 05/07/2023, 12:26 PM   Clinical Narrative: Attempted twice to explain to patient about going to rehab-she adamantly declines & states she is going to transport on her own & dont ask her again about going to [facility] rehab. No call back from dtr Deana. Patient is A+)x3. Supv/MD/Nsg notified of patient wishes. Unable to asst patient to going to rehab against her wishes.No further CM needs.      Final next level of care: Home/Self Care Barriers to Discharge: No Barriers Identified   Patient Goals and CMS Choice CMS Medicare.gov Compare Post Acute Care list provided to:: Patient Choice offered to / list presented to : Patient  Discharge Placement                         Discharge Plan and Services Additional resources added to the After Visit Summary for     Discharge Planning Services: CM Consult Post Acute Care Choice: Resumption of Svcs/PTA Provider                               Social Determinants of Health (SDOH) Interventions SDOH Screenings   Food Insecurity: No Food Insecurity (04/28/2023)  Housing: Low Risk  (04/28/2023)  Transportation Needs: No Transportation Needs (04/28/2023)  Utilities: Not At Risk (04/28/2023)  Tobacco Use: High Risk (04/30/2023)     Readmission Risk Interventions     No data to display

## 2023-05-07 NOTE — Discharge Summary (Signed)
Physician Discharge Summary  Jeanne Mcclure AVW:[id number] DOB: 08/26/1950 DOA: 04/27/2023  PCP: Pcp, No  Admit date: 04/27/2023 Discharge date: 05/07/2023  Time spent: 12 minutes  Recommendations for Outpatient Follow-up:  No recommendations made as patient left AGAINST MEDICAL ADVICE and as such no medications were prescribed  Discharge Diagnoses:  MAIN problem for hospitalization   Probable liver cancer  Please see below for itemized issues addressed in HOpsital- refer to other progress notes for clarity if needed  Discharge Condition: Very guarded  Diet recommendation: Regular  Filed Weights   05/03/23 0500 05/04/23 0500 05/07/23 0500  Weight: 76.9 kg 82 kg 89.3 kg    History of present illness:  72 year old black female known HTN DM TY 2 degenerative joint disease asthma bipolar recent ED visit cleared by psychiatry after suicide ideation 10/19--she was cleared by the ACT team and was sent home Patient went to a shelter subsequently and moved back into her own apartment She came back to the emergency room on 10/27 with right upper quadrant pain without eating or drinking and received fentanyl in the ED Workup ensued in the ED inclusive of CT abdomen pelvis which showed liver lesions concerning for malignancy and it looks like she had an uncomplicated diverticulitis at hepatic flexure and Zosyn was given she was somewhat tachycardic The hospitalist service admitted her and elucidated that she had lost close to 80 pounds She had a lactic acidosis on admission as well which clear 10/30 underwent core biopsy of the liver 10/31 oncology consulted Palliative care consulted as well patient did not want any further treatment and she wants to remain DNR/DNI She refused care during hospital stay did not want to pursue any treatments and oncology as well as palliative care signed off (she had initially been willing to go to hospice)   Hospital Course:  Diffuse metastatic liver  disease AFP CEA are elevated MRI brain negative liver biopsy 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 She seems to be refusing all care at this time and decided to leave AGAINST MEDICAL ADVICE She is not seem to be in pain I do not think she needs medications we will try and set her up with TOC to get a primary care physician and get plugged in to ensure that when she ultimately declines she can be referred  Sepsis on admission 2/2 acute diverticulitis Received 10 days of antibiotic therapy ending 10/6  Diabetes mellitus type 2-sugars were low normal-patient was eating 100% of meals-should not go home on any hypoglycemic agents  Recent homicidal/suicidal ideation Cleared recently by psychiatry   Discharge Exam: Vitals:   05/06/23 2240 05/07/23 0626  BP: 131/83 130/75  Pulse: (!) 101 (!) 103  Resp: 16 15  Temp:  98.4 F (36.9 C)  SpO2: 98% 94%    Subj on day of d/c   Awake coherent can tell me time date urine Wants to go home Refuses exam    Discharge Instructions      Allergies  Allergen Reactions   Hydrocodone Itching   Peanut-Containing Drug Products Itching    Mouth and throats itches   Codeine Itching   Egg Solids, Whole    Fruit Extracts    Iodine    Penicillins Itching and Rash    Contact information for follow-up providers     Artis Delay, MD Follow up in 3 day(s).   Specialty: Hematology and Oncology Contact information: [address] [place] [address] [phone]  Contact information for after-discharge care     Destination     [facility] Preferred SNF .   Service: Skilled Nursing Contact information: [address] [place] [address] [phone]                      The results of significant diagnostics from this hospitalization (including imaging, microbiology, ancillary and laboratory) are listed below for reference.     Significant Diagnostic Studies: MR BRAIN W WO CONTRAST  Result Date: 05/02/2023 CLINICAL DATA:  Initial metastatic disease evaluation, headache. EXAM: MRI HEAD WITHOUT AND WITH CONTRAST TECHNIQUE: Multiplanar, multiecho pulse sequences of the brain and surrounding structures were obtained without and with intravenous contrast. CONTRAST:  8mL GADAVIST GADOBUTROL 1 MMOL/ML IV SOLN COMPARISON:  None Available. FINDINGS: Brain: Mild age-related cerebral atrophy. Patchy and confluent T2/FLAIR hyperintensity involving the periventricular deep white matter both cerebral hemispheres, consistent with chronic small vessel ischemic disease, mild for age. No evidence for acute or subacute ischemia. Gray-white matter differentiation maintained. No areas of chronic cortical infarction. No acute or chronic intracranial blood products. No mass lesion or evidence for intracranial metastatic disease. No mass effect or midline shift. No hydrocephalus or extra-axial fluid collection. Empty sella noted. No abnormal enhancement. Vascular: Major intracranial vascular flow voids are maintained. Skull and upper cervical spine: Craniocervical junction within normal limits. Visualized bone marrow signal intensity diffusely heterogeneous without visible focal marrow replacing lesion. No scalp soft tissue abnormality. Sinuses/Orbits: Globes and orbital soft tissues within normal limits. Mild scattered mucosal thickening noted about the ethmoidal air cells. Paranasal sinuses are otherwise clear. No significant mastoid effusion. Other: None. IMPRESSION: 1. No acute intracranial abnormality. No evidence for intracranial metastatic disease. 2. Mild age-related cerebral atrophy with chronic small vessel ischemic disease. Electronically Signed   By: Rise Mu M.D.   On: 05/02/2023 02:18   US BIOPSY (LIVER)  Result Date: 04/30/2023 CLINICAL DATA:  Weight loss, right upper abdominal pain, flank pain, multiple liver lesions EXAM:  ULTRASOUND-GUIDED CORE LIVER BIOPSY TECHNIQUE: An ultrasound guided liver biopsy was thoroughly discussed with the patient and questions were answered. The benefits, risks, alternatives, and complications were also discussed. The patient understands and wishes to proceed with the procedure. A verbal as well as written consent was obtained. Survey ultrasound of the liver was performed, representative lesion localized, and an appropriate skin entry site was determined. Skin site was marked, prepped with chlorhexidine, and draped in usual sterile fashion, and infiltrated locally with 1% lidocaine. Intravenous Fentanyl and Versed 2mg  were administered as conscious sedation during continuous monitoring of the patient's level of consciousness and physiological / cardiorespiratory status by the radiology RN, with a total moderate sedation time of 10 minutes. A 17 gauge trocar needle was advanced under ultrasound guidance into the liver to the margin of the lesion. 2 solid-appearing 3 cm coaxial 18gauge core samples were then obtained through the guide needle. The guide needle was removed. Post procedure scans demonstrate no apparent complication. COMPLICATIONS: COMPLICATIONS None immediate FINDINGS: Multiple liver lesions identified. Representative right lobe lesion was approach for core biopsy sampling as above. IMPRESSION: 1. Technically successful ultrasound guided core liver lesion biopsy. Electronically Signed   By: Corlis Leak M.D.   On: 04/30/2023 15:44   MR ABDOMEN MRCP W WO CONTAST  Result Date: 04/28/2023 CLINICAL DATA:  72 year old female with history of right upper quadrant abdominal pain. Possible metastatic disease to the liver noted on prior CT examination. Follow-up study. EXAM: MRI ABDOMEN  WITHOUT AND WITH CONTRAST (INCLUDING MRCP) TECHNIQUE: Multiplanar multisequence MR imaging of the abdomen was performed both before and after the administration of intravenous contrast. Heavily T2-weighted  images of the biliary and pancreatic ducts were obtained, and three-dimensional MRCP images were rendered by post processing. CONTRAST:  7mL GADAVIST GADOBUTROL 1 MMOL/ML IV SOLN COMPARISON:  No prior abdominal MRI. CT of the chest, abdomen and pelvis 04/27/2023. FINDINGS: Lower chest: Small right pleural effusion lying dependently. Hepatobiliary: Scattered throughout the liver there are innumerable predominantly T1 hypointense, heterogeneous T2 signal intensity (centrally T2 hypointense with variable degrees of internal and peripheral T2 hyperintensity), and generally hypovascular lesions scattered throughout the hepatic parenchyma, all of which restrict diffusion, highly concerning for diffuse metastatic disease to the liver. Among the largest lesions is a dominant lesion in the superior aspect of the left lobe of the liver centered predominantly in segment 4A (axial image 28 of series 19) measuring 6.1 x 5.0 cm. The liver is diffusely enlarged measuring 25.5 cm in craniocaudal span and has a very nodular contour, likely reflective of metastatic disease (although underlying cirrhosis is not excluded). No intra or extrahepatic biliary ductal dilatation is noted. Gallbladder is unremarkable in appearance. Pancreas: No pancreatic mass. No pancreatic ductal dilatation. No pancreatic or peripancreatic fluid collections or inflammatory changes. Spleen:  Unremarkable. Adrenals/Urinary Tract: Bilateral kidneys and adrenal glands are normal in appearance. No hydroureteronephrosis in the visualized portions of the abdomen. Stomach/Bowel: Visualized portions are unremarkable. Vascular/Lymphatic: No aneurysm identified in the visualized abdominal vasculature. No lymphadenopathy noted in the abdomen. Other:  Trace volume of perihepatic ascites. Musculoskeletal: No aggressive appearing osseous lesions are noted in the visualized portions of the skeleton. IMPRESSION: 1. Severe hepatomegaly with infiltrative pattern of  innumerable hypovascular lesions throughout the liver, presumably reflective of widespread metastatic disease. No definite primary source confidently identified on today's examination. 2. Small right pleural effusion lying dependently. Electronically Signed   By: Trudie Reed M.D.   On: 04/28/2023 15:31   CT CHEST ABDOMEN PELVIS WO CONTRAST  Result Date: 04/27/2023 CLINICAL DATA:  Unintended weight loss, right upper quadrant pain and flank pain for several days. Dark colored urine. EXAM: CT CHEST, ABDOMEN AND PELVIS WITHOUT CONTRAST TECHNIQUE: Multidetector CT imaging of the chest, abdomen and pelvis was performed following the standard protocol without IV contrast. RADIATION DOSE REDUCTION: This exam was performed according to the departmental dose-optimization program which includes automated exposure control, adjustment of the mA and/or kV according to patient size and/or use of iterative reconstruction technique. COMPARISON:  Same day ultrasound FINDINGS: CT CHEST FINDINGS Cardiovascular: No pericardial effusion. Coronary artery and aortic atherosclerotic calcification. Normal caliber thoracic aorta. Mediastinum/Nodes: Trachea and esophagus are unremarkable. No thoracic adenopathy noting decreased sensitivity on noncontrast exam. Lungs/Pleura: Bibasilar atelectasis/scarring. Trace right pleural effusion. No pneumothorax. Musculoskeletal: No acute fracture or destructive osseous lesion. CT ABDOMEN PELVIS FINDINGS Hepatobiliary: The liver is enlarged and contains multiple poorly defined hypoattenuating lesions. These are incompletely evaluated without IV contrast. Small amount of subcapsular fluid about the anterior right hepatic lobe. Contracted gallbladder. No biliary dilation. Pancreas: Unremarkable. No pancreatic ductal dilatation or surrounding inflammatory changes. Spleen: Unremarkable. Adrenals/Urinary Tract: Normal adrenal glands and kidneys. Unremarkable bladder. Stomach/Bowel: Normal caliber  large and small bowel. Stranding and fluid about a diverticulum at the hepatic flexure (circa series 8/image 50.) normal appendix. Stomach is within normal limits. Vascular/Lymphatic: No evidence of lymphadenopathy on noncontrast exam. Aortic atherosclerotic calcification. Reproductive: Calcified uterine fibroids. Prominent ovaries are normal assessed without contrast. Other: Small volume perihepatic  and pelvic free fluid. No free intraperitoneal air. Musculoskeletal: No acute fracture or destructive osseous lesion. Age related spondylosis. Grade 1 anterolisthesis of L4. No destructive osseous lesion. Degenerative changes at the pubic symphysis. IMPRESSION: 1. Hepatomegaly with multiple poorly defined hypoattenuating lesions. These are incompletely evaluated without IV contrast but are concerning for malignancy. Recommend further evaluation with liver protocol MRI. 2. Acute uncomplicated diverticulitis at the hepatic flexure. 3. Small volume perihepatic and pelvic free fluid. Aortic Atherosclerosis (ICD10-I70.0). Electronically Signed   By: Minerva Fester M.D.   On: 04/27/2023 22:11   US Abdomen Limited RUQ (LIVER/GB)  Result Date: 04/27/2023 CLINICAL DATA:  Right upper quadrant pain for 2 weeks EXAM: ULTRASOUND ABDOMEN LIMITED RIGHT UPPER QUADRANT COMPARISON:  None Available. FINDINGS: Gallbladder: Gallbladder is contracted, with nonspecific gallbladder wall thickening measuring up to 5 mm. No evidence of cholelithiasis. Negative sonographic Murphy sign. Common bile duct: Diameter: 2 mm Liver: Evaluation of the liver parenchyma is limited due to respiratory motion. Liver is enlarged and heterogeneous, with multiple heterogeneous hyperechoic masslike areas identified. Largest in the right lobe measures 5.4 cm, and these masslike areas displaces the hepatic vasculature and do not demonstrate any significant increased Doppler flow. Dedicated liver CT is recommended for further evaluation. Portal vein is patent  on color Doppler imaging with normal direction of blood flow towards the liver. Other: None. IMPRESSION: 1. Gallbladder is contracted, limiting its evaluation. Gallbladder wall thickening measuring up to 5 mm is a nonspecific finding, with no other sonographic evidence of cholelithiasis or cholecystitis. 2. Diffuse heterogeneity throughout the liver parenchyma, with hyperechoic masslike areas as above. Sonographic appearance is nonspecific, and dedicated liver CT with without IV contrast is recommended. Electronically Signed   By: Sharlet Salina M.D.   On: 04/27/2023 17:51    Microbiology: No results found for this or any previous visit (from the past 240 hour(s)).   Labs: Basic Metabolic Panel: Recent Labs  Lab 05/01/23 0458 05/02/23 0507 05/03/23 0524  NA 135 138 133*  K 3.9 3.7 3.8  CL 102 99 97*  CO2 21* 22 21*  GLUCOSE 77 86 76  BUN 10 10 9   CREATININE 0.51 0.44 0.46  CALCIUM 8.7* 8.9 8.8*   Liver Function Tests: No results for input(s): "AST", "ALT", "ALKPHOS", "BILITOT", "PROT", "ALBUMIN" in the last 168 hours. No results for input(s): "LIPASE", "AMYLASE" in the last 168 hours. No results for input(s): "AMMONIA" in the last 168 hours. CBC: Recent Labs  Lab 05/01/23 0458 05/02/23 0507 05/03/23 0524  WBC 7.7 8.1 8.7  HGB 10.0* 9.3* 9.6*  HCT 31.3* 28.5* 29.8*  MCV 98.4 96.6 99.3  PLT 426* 439* 462*   Cardiac Enzymes: No results for input(s): "CKTOTAL", "CKMB", "CKMBINDEX", "TROPONINI" in the last 168 hours. BNP: BNP (last 3 results) No results for input(s): "BNP" in the last 8760 hours.  ProBNP (last 3 results) No results for input(s): "PROBNP" in the last 8760 hours.  CBG: Recent Labs  Lab 05/01/23 0807  GLUCAP 83       Signed:  Rhetta Mura MD   [facility] 05/07/2023, 2:32 PM

## 2023-05-07 NOTE — TOC Transition Note (Addendum)
Transition of Care [facility]) - CM/SW Discharge Note   Patient Details  Name: Jeanne Mcclure MRN: [id number] Date of Birth: 08-19-1950  Transition of Care [facility]) CM/SW Contact:  Lanier Clam, RN Phone Number: 05/07/2023, 2:28 PM   Clinical Narrative: spoke to dtr Deana about d/c & patient declining ST SNF @ [facility] dtr Deana agrees & will take patient to her house-[address] [place] [address]. Will arrange HHPT/OT, rw [facility] to deliver to rm prior d/c.   -2:38p-noted patient has left prior getting any HHC or dme set up. Dtr will have to get set up on own with PCP.    Final next level of care: Home w Home Health Services Barriers to Discharge: No Barriers Identified   Patient Goals and CMS Choice CMS Medicare.gov Compare Post Acute Care list provided to:: Patient Choice offered to / list presented to : Patient  Discharge Placement                         Discharge Plan and Services Additional resources added to the After Visit Summary for     Discharge Planning Services: CM Consult Post Acute Care Choice: Resumption of Svcs/PTA Provider                               Social Determinants of Health (SDOH) Interventions SDOH Screenings   Food Insecurity: No Food Insecurity (04/28/2023)  Housing: Low Risk  (04/28/2023)  Transportation Needs: No Transportation Needs (04/28/2023)  Utilities: Not At Risk (04/28/2023)  Tobacco Use: High Risk (04/30/2023)     Readmission Risk Interventions     No data to display

## 2023-05-07 NOTE — Progress Notes (Signed)
OT Cancellation Note  Patient Details Name: Naomi Castrogiovanni MRN: [id number] DOB: 1951/02/23   Cancelled Treatment:    Reason Eval/Treat Not Completed: Patient declined, no reason specified. Pt received, lying sideways across bed, fully dressed. Adamantly refusing participation in therapy at this time, becomes irritated with attempts at education and encouragement. Pt stating she was supposed to be discharged several days ago, and wants to leave hospital to stay in a hotel or with family. Pt states that she does not want to continue OT and would like this writer to sign off. Care team updated via secure chat.    Ezmae Speers L. Arcangel Minion, OTR/L  05/07/23, 11:03 AM

## 2023-05-07 NOTE — Progress Notes (Signed)
Pt is refusing to allow staff members in her room, is wanting to sign out against medical advice but is refusing to sign paper. Will not let staff members talk to her or try to discuss other options. Called and spoke with daughter and explained to her the patients wishes. At this time charge nurse Shanda Bumps was aware as well, patient left unit and refused to talk to [place] or tell us where she is going. AMA form in chart. Will call daughter.

## 2023-05-13 ENCOUNTER — Other Ambulatory Visit: Payer: Self-pay | Admitting: Hematology and Oncology

## 2023-05-13 DIAGNOSIS — C787 Secondary malignant neoplasm of liver and intrahepatic bile duct: Secondary | ICD-10-CM

## 2023-05-13 NOTE — Progress Notes (Signed)
Called [facility] and spoke with Haynes Bast, RN. Dr. Bertis Ruddy sent a hospice referral per daughters request. They can see the referral and will work on getting [facility] admitted. Dr. Bertis Ruddy declines to be attending and [place] is aware.

## 2023-06-01 DEATH — deceased
